# Patient Record
Sex: Male | Born: 1980 | Race: White | Hispanic: No | Marital: Single | Smoking: Never smoker
Health system: Southern US, Community
[De-identification: ages and names within clinical notes are randomized; demographics above are authoritative.]

## PROBLEM LIST (undated history)

## (undated) DIAGNOSIS — K219 Gastro-esophageal reflux disease without esophagitis: Secondary | ICD-10-CM

## (undated) HISTORY — PX: FRACTURE SURGERY: SHX138

---

## 2011-05-02 DIAGNOSIS — J45990 Exercise induced bronchospasm: Secondary | ICD-10-CM | POA: Insufficient documentation

## 2011-05-02 DIAGNOSIS — R109 Unspecified abdominal pain: Secondary | ICD-10-CM | POA: Insufficient documentation

## 2011-05-02 DIAGNOSIS — K219 Gastro-esophageal reflux disease without esophagitis: Secondary | ICD-10-CM | POA: Insufficient documentation

## 2012-04-02 ENCOUNTER — Ambulatory Visit: Payer: Self-pay | Admitting: Family Medicine

## 2012-11-14 DIAGNOSIS — S02401D Maxillary fracture, unspecified, subsequent encounter for fracture with routine healing: Secondary | ICD-10-CM | POA: Insufficient documentation

## 2013-01-09 DIAGNOSIS — Z09 Encounter for follow-up examination after completed treatment for conditions other than malignant neoplasm: Secondary | ICD-10-CM | POA: Insufficient documentation

## 2013-01-09 DIAGNOSIS — S0292XA Unspecified fracture of facial bones, initial encounter for closed fracture: Secondary | ICD-10-CM | POA: Insufficient documentation

## 2013-07-25 ENCOUNTER — Ambulatory Visit: Payer: Self-pay | Admitting: Otolaryngology

## 2016-03-25 DIAGNOSIS — K529 Noninfective gastroenteritis and colitis, unspecified: Secondary | ICD-10-CM | POA: Insufficient documentation

## 2016-04-01 DIAGNOSIS — M545 Low back pain, unspecified: Secondary | ICD-10-CM | POA: Insufficient documentation

## 2019-05-01 ENCOUNTER — Other Ambulatory Visit: Payer: Self-pay

## 2019-05-01 DIAGNOSIS — Z20822 Contact with and (suspected) exposure to covid-19: Secondary | ICD-10-CM

## 2019-05-02 ENCOUNTER — Telehealth: Payer: Self-pay | Admitting: General Practice

## 2019-05-02 LAB — NOVEL CORONAVIRUS, NAA: SARS-CoV-2, NAA: NOT DETECTED

## 2019-05-02 NOTE — Telephone Encounter (Signed)
Pt's wife was informed of negative COVID-19 test result.  Pt's wife expressed understanding.

## 2019-06-20 ENCOUNTER — Other Ambulatory Visit: Payer: Self-pay

## 2020-05-05 ENCOUNTER — Ambulatory Visit
Admission: EM | Admit: 2020-05-05 | Discharge: 2020-05-05 | Disposition: A | Discharge status: Home / Self Care | Payer: 59

## 2020-05-05 ENCOUNTER — Other Ambulatory Visit: Payer: Self-pay

## 2020-05-05 DIAGNOSIS — K2100 Gastro-esophageal reflux disease with esophagitis, without bleeding: Secondary | ICD-10-CM

## 2020-05-05 DIAGNOSIS — R1084 Generalized abdominal pain: Secondary | ICD-10-CM | POA: Diagnosis not present

## 2020-05-05 MED ORDER — FAMOTIDINE 20 MG PO TABS: 20.0000 mg | ORAL_TABLET | Freq: Two times a day (BID) | ORAL | 0 refills | Status: DC

## 2020-05-05 MED ORDER — METRONIDAZOLE 500 MG PO TABS: 500.0000 mg | ORAL_TABLET | Freq: Two times a day (BID) | ORAL | 0 refills | Status: DC

## 2020-05-05 MED ORDER — CIPROFLOXACIN HCL 500 MG PO TABS: 500.0000 mg | ORAL_TABLET | Freq: Two times a day (BID) | ORAL | 0 refills | Status: DC

## 2020-05-05 NOTE — ED Provider Notes (Signed)
[facility]   [id number] 05/05/20 Arrival Time: 1056  CC: ABDOMINAL PAIN  SUBJECTIVE:  Carl Mccarty is a 39 y.o. male who presents with complaint of abdominal discomfort that began gradually about 2 months ago. Reports that he does have hx of epiploic appendagitis. Takes omeprazole for GERD BID. Denies a precipitating event, trauma, close contacts with similar symptoms, recent travel or antibiotic use. Localizes pain to generalized upper abdomen. Describes as constant and achy and burning in character. Has been taking ibuprofen for epiploic appendagitis. Denies alleviating or aggravating factors. Denies similar symptoms in the past.     Denies fever, chills, appetite changes, weight changes, nausea, vomiting, chest pain, SOB, diarrhea, constipation, hematochezia, melena, dysuria, difficulty urinating, increased frequency or urgency, flank pain, loss of bowel or bladder function   ROS: As per HPI.  All other pertinent ROS negative.     History reviewed. No pertinent past medical history. Past Surgical History:  Procedure Laterality Date  . FRACTURE SURGERY     Allergies  Allergen Reactions  . Oxycodone Rash   No current facility-administered medications on file prior to encounter.   Current Outpatient Medications on File Prior to Encounter  Medication Sig Dispense Refill  . omeprazole (PRILOSEC) 40 MG capsule Take 40 mg by mouth daily.     Social History   Socioeconomic History  . Marital status: Single    Spouse name: Not on file  . Number of children: Not on file  . Years of education: Not on file  . Highest education level: Not on file  Occupational History  . Not on file  Tobacco Use  . Smoking status: Never Smoker  . Smokeless tobacco: Never Used  Vaping Use  . Vaping Use: Never used  Substance and Sexual Activity  . Alcohol use: Never  . Drug use: Never  . Sexual activity: Not on file  Other Topics Concern  . Not on file  Social History Narrative   . Not on file   Social Determinants of Health   Financial Resource Strain:   . Difficulty of Paying Living Expenses: Not on file  Food Insecurity:   . Worried About Programme researcher, broadcasting/film/video in the Last Year: Not on file  . Ran Out of Food in the Last Year: Not on file  Transportation Needs:   . Lack of Transportation (Medical): Not on file  . Lack of Transportation (Non-Medical): Not on file  Physical Activity:   . Days of Exercise per Week: Not on file  . Minutes of Exercise per Session: Not on file  Stress:   . Feeling of Stress : Not on file  Social Connections:   . Frequency of Communication with Friends and Family: Not on file  . Frequency of Social Gatherings with Friends and Family: Not on file  . Attends Religious Services: Not on file  . Active Member of Clubs or Organizations: Not on file  . Attends Banker Meetings: Not on file  . Marital Status: Not on file  Intimate Partner Violence:   . Fear of Current or Ex-Partner: Not on file  . Emotionally Abused: Not on file  . Physically Abused: Not on file  . Sexually Abused: Not on file   Family History  Problem Relation Age of Onset  . Breast cancer Mother   . Dementia Father      OBJECTIVE:  Vitals:   05/05/20 1116 05/05/20 1118  BP:  (!) 132/96  Pulse:  69  Resp:  18  Temp:  98.2 F (36.8 C)  TempSrc:  Oral  SpO2:  99%  Weight: 180 lb (81.6 kg)   Height: 5\' 8"  (1.727 m)     General appearance: Alert; NAD HEENT: NCAT.  Oropharynx clear.  Lungs: clear to auscultation bilaterally without adventitious breath sounds Heart: regular rate and rhythm.  Radial pulses 2+ symmetrical bilaterally Abdomen: soft, non-distended; normal active bowel sounds; generalized tenderness to upper abdomen on deep palpation; nontender at McBurney's point; negative Murphy's sign; negative rebound; no guarding Back: no CVA tenderness Extremities: no edema; symmetrical with no gross deformities Skin: warm and  dry Neurologic: normal gait Psychological: alert and cooperative; normal mood and affect  LABS: No results found for this or any previous visit (from the past 24 hour(s)).  DIAGNOSTIC STUDIES: No results found.   ASSESSMENT & PLAN:  1. Generalized abdominal pain   2. Gastroesophageal reflux disease with esophagitis without hemorrhage     Meds ordered this encounter  Medications  . famotidine (PEPCID) 20 MG tablet    Sig: Take 1 tablet (20 mg total) by mouth 2 (two) times daily.    Dispense:  30 tablet    Refill:  0    Order Specific Question:   Supervising Provider    Answer:   Merrilee Jansky  . ciprofloxacin (CIPRO) 500 MG tablet    Sig: Take 1 tablet (500 mg total) by mouth 2 (two) times daily.    Dispense:  14 tablet    Refill:  0    Order Specific Question:   Supervising Provider    Answer:   [id number] Merrilee Jansky  . metroNIDAZOLE (FLAGYL) 500 MG tablet    Sig: Take 1 tablet (500 mg total) by mouth 2 (two) times daily.    Dispense:  14 tablet    Refill:  0    Order Specific Question:   Supervising Provider    Answer:   [id number] Merrilee Jansky     Suspect inflammatory and possibly infectious process Prescribed pepcid 20mg  BID Prescribed Cipro Prescribed Flagyl Upon reviewing CT report and note from [facility], it appears that infectious process could not be ruled out and patient was treated with antiinflammatories first  If you experience new or worsening symptoms return or go to ER such as fever, chills, nausea, vomiting, diarrhea, bloody or dark tarry stools, constipation, urinary symptoms, worsening abdominal discomfort, symptoms that do not improve with medications, inability to keep fluids down.  Reviewed expectations re: course of current medical issues. Questions answered. Outlined signs and symptoms indicating need for more acute intervention. Patient verbalized understanding. After Visit Summary given.   [[id number]], NP 05/05/20  1211

## 2020-05-05 NOTE — ED Triage Notes (Signed)
Patient states that he has been having abdominal pain that started back in June. Reports that he had a CT scan at [facility] in July and showed epiplogic appendagitis. States that he finished the ibuprofen they gave him but has not had any relief since.

## 2020-05-05 NOTE — Discharge Instructions (Addendum)
I have prescribed Cipro for you to take twice day for 7 days  I have also sent in flagyl for you to take twice a day for 7 days  Take Pepcid 20mg  twice a day with meals for 14 days  Follow up with this office or with primary care if symptoms are persisting  Follow up in the ER for high fever, trouble swallowing, trouble breathing, other concerning symptoms

## 2020-06-09 ENCOUNTER — Other Ambulatory Visit: Payer: Self-pay

## 2020-06-09 ENCOUNTER — Ambulatory Visit (INDEPENDENT_AMBULATORY_CARE_PROVIDER_SITE_OTHER)
Admission: RE | Admit: 2020-06-09 | Discharge: 2020-06-09 | Disposition: A | Discharge status: Home / Self Care | Payer: 59 | Source: Ambulatory Visit | Attending: Family Medicine | Admitting: Family Medicine

## 2020-06-09 ENCOUNTER — Ambulatory Visit
Admission: RE | Admit: 2020-06-09 | Discharge: 2020-06-09 | Disposition: A | Discharge status: Home / Self Care | Payer: 59 | Source: Ambulatory Visit | Attending: Family Medicine | Admitting: Family Medicine

## 2020-06-09 VITALS — BP 128/96 | HR 78 | Temp 98.3°F | Resp 18 | Ht 68.0 in | Wt 180.0 lb

## 2020-06-09 DIAGNOSIS — R109 Unspecified abdominal pain: Secondary | ICD-10-CM

## 2020-06-09 DIAGNOSIS — K6389 Other specified diseases of intestine: Secondary | ICD-10-CM | POA: Diagnosis present

## 2020-06-09 HISTORY — DX: Gastro-esophageal reflux disease without esophagitis: K21.9

## 2020-06-09 LAB — CBC WITH DIFFERENTIAL/PLATELET
Abs Immature Granulocytes: 0.02 10*3/uL (ref 0.00–0.07)
Basophils Absolute: 0 10*3/uL (ref 0.0–0.1)
Basophils Relative: 0 %
Eosinophils Absolute: 0.1 10*3/uL (ref 0.0–0.5)
Eosinophils Relative: 2 %
HCT: 44.9 % (ref 39.0–52.0)
Hemoglobin: 15.3 g/dL (ref 13.0–17.0)
Immature Granulocytes: 0 %
Lymphocytes Relative: 28 %
Lymphs Abs: 2.2 10*3/uL (ref 0.7–4.0)
MCH: 29 pg (ref 26.0–34.0)
MCHC: 34.1 g/dL (ref 30.0–36.0)
MCV: 85 fL (ref 80.0–100.0)
Monocytes Absolute: 0.7 10*3/uL (ref 0.1–1.0)
Monocytes Relative: 8 %
Neutro Abs: 4.9 10*3/uL (ref 1.7–7.7)
Neutrophils Relative %: 62 %
Platelets: 318 10*3/uL (ref 150–400)
RBC: 5.28 MIL/uL (ref 4.22–5.81)
RDW: 12.2 % (ref 11.5–15.5)
WBC: 7.8 10*3/uL (ref 4.0–10.5)
nRBC: 0 % (ref 0.0–0.2)

## 2020-06-09 LAB — COMPREHENSIVE METABOLIC PANEL
ALT: 31 U/L (ref 0–44)
AST: 23 U/L (ref 15–41)
Albumin: 4.9 g/dL (ref 3.5–5.0)
Alkaline Phosphatase: 69 U/L (ref 38–126)
Anion gap: 8 (ref 5–15)
BUN: 17 mg/dL (ref 6–20)
CO2: 28 mmol/L (ref 22–32)
Calcium: 9.5 mg/dL (ref 8.9–10.3)
Chloride: 102 mmol/L (ref 98–111)
Creatinine, Ser: 0.97 mg/dL (ref 0.61–1.24)
GFR, Estimated: >60 mL/min (ref >60)
Glucose, Bld: 100 mg/dL — ABNORMAL HIGH (ref 70–99)
Potassium: 4 mmol/L (ref 3.5–5.1)
Sodium: 138 mmol/L (ref 135–145)
Total Bilirubin: 0.5 mg/dL (ref 0.3–1.2)
Total Protein: 8.1 g/dL (ref 6.5–8.1)

## 2020-06-09 LAB — LIPASE, BLOOD: Lipase: 38 U/L (ref 11–51)

## 2020-06-09 MED ORDER — IOHEXOL 300 MG/ML  SOLN
100.0000 mL | Freq: Once | INTRAMUSCULAR | Status: AC | PRN
  Administered 2020-06-09: 100 mL via INTRAVENOUS

## 2020-06-09 MED ORDER — TRAMADOL HCL 50 MG PO TABS: 50.0000 mg | ORAL_TABLET | Freq: Three times a day (TID) | ORAL | 0 refills | Status: DC | PRN

## 2020-06-09 NOTE — ED Triage Notes (Signed)
Called [employer] at [phone] for prior auth of CT Abd/pelvis with contrast (CPT [phone]). Spoke with Kurtis Bushman, approved, auth# [id number] valid 06/09/20 to 07/24/20.

## 2020-06-09 NOTE — ED Provider Notes (Signed)
[facility]    CSN: [id number] Arrival date & time: 06/09/20  1357      History   Chief Complaint Chief Complaint  Patient presents with  . Appointment  . Abdominal Pain  . Nausea   HPI  39 year old Mccarty presents with persistent abdominal pain.  Patient states that he has had this for the past 6 months.  He was previously seen in July and had a CAT scan done by his primary care physician.  CT revealed epiploic appendagitis.  He has been treated conservatively and has also been treated with antibiotics for possible diverticulitis.  He has failed to improve.  Continues to have pain, 8/10 in severity.  He has had some associated nausea today.  No relieving factors.  No fever.  No diarrhea.  He endorses some mild constipation.  Pain is located in the left lower quadrant.  No other complaints.  Past Medical History:  Diagnosis Date  . GERD (gastroesophageal reflux disease)    Past Surgical History:  Procedure Laterality Date  . FRACTURE SURGERY     Home Medications    Prior to Admission medications   Medication Sig Start Date End Date Taking? Authorizing Provider  omeprazole (PRILOSEC) 40 MG capsule Take 40 mg by mouth daily. 04/14/20  Yes [provider]  traMADol (ULTRAM) Carl MG tablet Take 1 tablet (Carl mg total) by mouth every 8 (eight) hours as needed. 06/09/20   Tommie Sams, DO  famotidine (PEPCID) 20 MG tablet Take 1 tablet (20 mg total) by mouth 2 (two) times daily. 05/05/20 06/09/20  Moshe Cipro, NP    Family History Family History  Problem Relation Age of Onset  . Breast cancer Mother   . Dementia Father     Social History Social History   Tobacco Use  . Smoking status: Never Smoker  . Smokeless tobacco: Never Used  Vaping Use  . Vaping Use: Never used  Substance Use Topics  . Alcohol use: Never  . Drug use: Never     Allergies   Oxycodone   Review of Systems Review of Systems  Constitutional: Negative for fever.   Gastrointestinal: Positive for abdominal pain and nausea.   Physical Exam Triage Vital Signs ED Triage Vitals [06/09/20 1413]  Enc Vitals Group     BP (!) 135/113     Pulse Rate 78     Resp 18     Temp 98.3 F (36.8 C)     Temp Source Oral     SpO2 99 %     Weight 180 lb (81.6 kg)     Height 5\' 8"  (1.727 m)     Head Circumference      Peak Flow      Pain Score 8     Pain Loc      Pain Edu?      Excl. in GC?    Updated Vital Signs BP (!) 128/96 (BP Location: Right Arm)   Pulse 78   Temp 98.3 F (36.8 C) (Oral)   Resp 18   Ht 5\' 8"  (1.727 m)   Wt 81.6 kg   SpO2 99%   BMI 27.37 kg/m   Visual Acuity Right Eye Distance:   Left Eye Distance:   Bilateral Distance:    Right Eye Near:   Left Eye Near:    Bilateral Near:     Physical Exam Vitals and nursing note reviewed.  Constitutional:      General: He is not in acute  distress.    Appearance: He is well-developed. He is not ill-appearing.  HENT:     Head: Normocephalic and atraumatic.  Eyes:     General:        Right eye: No discharge.        Left eye: No discharge.     Conjunctiva/sclera: Conjunctivae normal.  Cardiovascular:     Rate and Rhythm: Normal rate and regular rhythm.     Heart sounds: No murmur heard.   Pulmonary:     Effort: Pulmonary effort is normal.     Breath sounds: Normal breath sounds. No wheezing, rhonchi or rales.  Abdominal:     General: There is no distension.     Palpations: Abdomen is soft.     Comments: LLQ tenderness to palpation.  Neurological:     Mental Status: He is alert.  Psychiatric:        Mood and Affect: Mood normal.        Behavior: Behavior normal.    UC Treatments / Results  Labs (all labs ordered are listed, but only abnormal results are displayed) Labs Reviewed  COMPREHENSIVE METABOLIC PANEL - Abnormal; Notable for the following components:      Result Value   Glucose, Bld 100 (*)    All other components within normal limits  CBC WITH  DIFFERENTIAL/PLATELET  LIPASE, BLOOD    EKG   Radiology CT ABDOMEN PELVIS W CONTRAST  Result Date: 06/09/2020 CLINICAL DATA:  Continued pain on antibiotics EXAM: CT ABDOMEN AND PELVIS WITH CONTRAST TECHNIQUE: Multidetector CT imaging of the abdomen and pelvis was performed using the standard protocol following bolus administration of intravenous contrast. CONTRAST:  OMNIPAQUE IOHEXOL 300 MG/ML  SOLN COMPARISON:  The prior CT is not available for comparison. FINDINGS: Lower chest: Lung bases demonstrate no acute consolidation or effusion. Normal cardiac size. Hepatobiliary: No focal liver abnormality is seen. No gallstones, gallbladder wall thickening, or biliary dilatation. Pancreas: Unremarkable. No pancreatic ductal dilatation or surrounding inflammatory changes. Spleen: Normal in size without focal abnormality. Adrenals/Urinary Tract: Adrenal glands are unremarkable. Kidneys are normal, without renal calculi, focal lesion, or hydronephrosis. Bladder is unremarkable. Stomach/Bowel: The stomach is nonenlarged. No dilated small bowel. Negative appendix. No colon wall thickening. Few isolated diverticula of the left colon. Vascular/Lymphatic: Nonaneurysmal aorta.  No suspicious adenopathy. Reproductive: Prostate is unremarkable. Other: Negative for free air or free fluid. 11 mm left lower quadrant soft tissue nodule, anterior to the distal descending colon with minimal surrounding soft tissue stranding. Musculoskeletal: No acute or significant osseous findings. IMPRESSION: 1. 11 mm left lower quadrant soft tissue nodule, anterior to the distal descending colon with minimal surrounding soft tissue stranding. Findings are nonspecific and could be secondary to sequela of prior small omental infarct or epiploic appendagitis. Consider short interval CT follow-up to ensure resolution. The adjacent colon appears normal. 2. Otherwise no CT evidence for acute intra-abdominal or pelvic abnormality.  Electronically Signed   By: Jasmine Pang M.D.   On: 06/09/2020 16:01    Procedures Procedures (including critical care time)  Medications Ordered in UC Medications - No data to display  Initial Impression / Assessment and Plan / UC Course  I have reviewed the triage vital signs and the nursing notes.  Pertinent labs & imaging results that were available during my care of the patient were reviewed by me and considered in my medical decision making (see chart for details).    39 year old Mccarty presents with persistent/chronic left lower quadrant pain.  CT  abdomen and pelvis obtained today.  He has a 11 mm soft tissue nodule that is likely consistent with omental infarct versus epiploic appendagitis.  CT in July of this year showed epiploic appendagitis.  I have spoken with GI Dr. Servando Snare regarding his case.  He recommended that he be referred to surgery.  Referral to general surgery placed.  Tramadol for pain.  Final Clinical Impressions(s) / UC Diagnoses   Final diagnoses:  Epiploic appendagitis     Discharge Instructions     Pain medication as directed.  I have placed a referral to general surgery. Give them a call so that appt can be set up.  Take care  Dr. Adriana Simas    ED Prescriptions    Medication Sig Dispense Auth. Provider   traMADol (ULTRAM) Carl MG tablet Take 1 tablet (Carl mg total) by mouth every 8 (eight) hours as needed. 15 tablet Everlene Other G, DO     I have reviewed the PDMP during this encounter.   Tommie Sams, [place] 06/09/20 1654

## 2020-06-09 NOTE — ED Triage Notes (Signed)
Patient in today c/o abdominal pain and nausea x 6 months. Patient seen for same at [facility] on 05/05/20 for same symptoms. Patient saw his PCP in June for the same symptoms and patient had a CT scan that showed epiploic appendagitis in the left mid pelvic. Patient has not followed back up with PCP.

## 2020-06-09 NOTE — Discharge Instructions (Signed)
Pain medication as directed.  I have placed a referral to general surgery. Give them a call so that appt can be set up.  Take care  Dr. Adriana Simas

## 2020-06-16 ENCOUNTER — Ambulatory Visit (INDEPENDENT_AMBULATORY_CARE_PROVIDER_SITE_OTHER): Payer: 59 | Admitting: Surgery

## 2020-06-16 ENCOUNTER — Encounter: Payer: Self-pay | Admitting: Surgery

## 2020-06-16 ENCOUNTER — Other Ambulatory Visit: Payer: Self-pay

## 2020-06-16 VITALS — BP 130/91 | HR 83 | Temp 98.5°F | Ht 68.0 in | Wt 179.6 lb

## 2020-06-16 DIAGNOSIS — K6389 Other specified diseases of intestine: Secondary | ICD-10-CM

## 2020-06-16 NOTE — Patient Instructions (Addendum)
You can get some fiber supplements over-the-counter to help with constipation, eat a high fiber diet and drink plenty of water. If you have any concerns or questions, please feel free to call our office.    High-Fiber Diet Fiber, also called dietary fiber, is a type of carbohydrate that is found in fruits, vegetables, whole grains, and beans. A high-fiber diet can have many health benefits. Your health care provider may recommend a high-fiber diet to help:  Prevent constipation. Fiber can make your bowel movements more regular.  Lower your cholesterol.  Relieve the following conditions: ? Swelling of veins in the anus (hemorrhoids). ? Swelling and irritation (inflammation) of specific areas of the digestive tract (uncomplicated diverticulosis). ? A problem of the large intestine (colon) that sometimes causes pain and diarrhea (irritable bowel syndrome, IBS).  Prevent overeating as part of a weight-loss plan.  Prevent heart disease, type 2 diabetes, and certain cancers. What is my plan? The recommended daily fiber intake in grams (g) includes:  38 g for men age 68 or younger.  30 g for men over age 49.  25 g for women age 46 or younger.  21 g for women over age 2. You can get the recommended daily intake of dietary fiber by:  Eating a variety of fruits, vegetables, grains, and beans.  Taking a fiber supplement, if it is not possible to get enough fiber through your diet. What do I need to know about a high-fiber diet?  It is better to get fiber through food sources rather than from fiber supplements. There is not a lot of research about how effective supplements are.  Always check the fiber content on the nutrition facts label of any prepackaged food. Look for foods that contain 5 g of fiber or more per serving.  Talk with a diet and nutrition specialist (dietitian) if you have questions about specific foods that are recommended or not recommended for your medical condition,  especially if those foods are not listed below.  Gradually increase how much fiber you consume. If you increase your intake of dietary fiber too quickly, you may have bloating, cramping, or gas.  Drink plenty of water. Water helps you to digest fiber. What are tips for following this plan?  Eat a wide variety of high-fiber foods.  Make sure that half of the grains that you eat each day are whole grains.  Eat breads and cereals that are made with whole-grain flour instead of refined flour or white flour.  Eat brown rice, bulgur wheat, or millet instead of white rice.  Start the day with a breakfast that is high in fiber, such as a cereal that contains 5 g of fiber or more per serving.  Use beans in place of meat in soups, salads, and pasta dishes.  Eat high-fiber snacks, such as berries, raw vegetables, nuts, and popcorn.  Choose whole fruits and vegetables instead of processed forms like juice or sauce. What foods can I eat?  Fruits Berries. Pears. Apples. Oranges. Avocado. Prunes and raisins. Dried figs. Vegetables Sweet potatoes. Spinach. Kale. Artichokes. Cabbage. Broccoli. Cauliflower. Green peas. Carrots. Squash. Grains Whole-grain breads. Multigrain cereal. Oats and oatmeal. Brown rice. Barley. Bulgur wheat. Millet. Quinoa. Bran muffins. Popcorn. Rye wafer crackers. Meats and other proteins Navy, kidney, and pinto beans. Soybeans. Split peas. Lentils. Nuts and seeds. Dairy Fiber-fortified yogurt. Beverages Fiber-fortified soy milk. Fiber-fortified orange juice. Other foods Fiber bars. The items listed above may not be a complete list of recommended foods and  beverages. Contact a dietitian for more options. What foods are not recommended? Fruits Fruit juice. Cooked, strained fruit. Vegetables Fried potatoes. Canned vegetables. Well-cooked vegetables. Grains White bread. Pasta made with refined flour. White rice. Meats and other proteins Fatty cuts of meat. Fried  chicken or fried fish. Dairy Milk. Yogurt. Cream cheese. Sour cream. Fats and oils Butters. Beverages Soft drinks. Other foods Cakes and pastries. The items listed above may not be a complete list of foods and beverages to avoid. Contact a dietitian for more information. Summary  Fiber is a type of carbohydrate. It is found in fruits, vegetables, whole grains, and beans.  There are many health benefits of eating a high-fiber diet, such as preventing constipation, lowering blood cholesterol, helping with weight loss, and reducing your risk of heart disease, diabetes, and certain cancers.  Gradually increase your intake of fiber. Increasing too fast can result in cramping, bloating, and gas. Drink plenty of water while you increase your fiber.  The best sources of fiber include whole fruits and vegetables, whole grains, nuts, seeds, and beans. This information is not intended to replace advice given to you by your health care provider. Make sure you discuss any questions you have with your health care provider. Document Revised: 04/24/2017 Document Reviewed: 04/24/2017 Elsevier Patient Education  2020 Elsevier Inc.     Epiploic Appendagitis  Epiploic appendagitis (EA) is swelling and irritation of pouches (epiploic appendages) that are attached to the end portion of the large intestine (colon). These pouches contain fat and are attached to the outside of the colon. This condition causes sudden pain in the lower abdomen. EA is rare, and it usually goes away on its own. It can feel like other abdomen (abdominal) conditions, such as appendicitis, a gallbladder attack (cholecystitis), or diverticulitis. What are the causes? This condition may be caused by:  Blocked blood flow due to a blood clot.  Twisting (torsion) of the epiploic appendages.  Conditions that cause swelling and inflammation of nearby tissue, such as long-term (chronic) diarrhea, Crohn disease, or ulcerative  colitis. What increases the risk? You are more likely to develop this condition if:  You are 65-7 years old.  You are male.  You are overweight. What are the signs or symptoms? The most common symptom of this condition is lower abdominal pain that starts suddenly and can be severe. Pain can be anywhere in the lower abdomen, but it is more common on the left side. The pain may get worse with movement or when pressing on your abdomen. The following symptoms are possible, but they are not common:  Fever.  Nausea.  Diarrhea or constipation. How is this diagnosed? Your health care provider may suspect EA if you have sudden lower abdominal pain without other symptoms. The condition may be diagnosed based on:  CT scan. This is the best way to diagnose EA.  Your symptoms and a physical exam.  Ultrasound.  A blood test (complete blood count, CBC).  A procedure to look inside your abdomen using a lighted scope that has a tiny camera on the end (laparoscopy). This may be done to confirm the diagnosis. How is this treated? EA usually goes away without treatment. Your health care provider may recommend NSAIDs (such as aspirin or ibuprofen) to reduce pain and inflammation. In rare cases, if the condition does not improve or it keeps coming back, you may need surgery to remove the appendages. Follow these instructions at home:  Take over-the-counter and prescription medicines only as told  by your health care provider.  Return to your normal activities as told by your health care provider. Ask your health care provider what activities are safe for you.  Keep all follow-up visits as told by your health care provider. This is important. Contact a health care provider if:  You have a fever.  You develop nausea, vomiting, diarrhea, or constipation.  Your pain gets worse.  Your pain lasts longer than 10 days. Get help right away if:  You have severe pain that does not get better after  you take medicine. Summary  Epiploic appendagitis (EA) is swelling and irritation of pouches (epiploic appendages) that are attached to the outside of the colon. The colon is the end portion of the large intestine.  EA can feel like other abdominal conditions, such as appendicitis, a gallbladder attack (cholecystitis), or diverticulitis.  EA usually goes away without treatment. Your health care provider may recommend NSAIDs (such as aspirin or ibuprofen) to reduce pain and inflammation. This information is not intended to replace advice given to you by your health care provider. Make sure you discuss any questions you have with your health care provider. Document Revised: 10/11/2018 Document Reviewed: 01/05/2017 Elsevier Patient Education  2020 [employer].

## 2020-06-16 NOTE — Progress Notes (Signed)
Patient ID: Carl Mccarty, male   DOB: 06/01/1981, 39 y.o.   MRN: [id number]  Chief Complaint: Left upper quadrant pain  History of Present Illness Carl Mccarty is a 39 y.o. male with about a 31-month history of left upper quadrant pain which radiates down the left lateral abdomen and across the epigastrium.  Prior history of pyrosis, currently treated with PPI.  He reports having a colonoscopy years ago.  He admits marked irregularity of his bowel habits.  He admits to in a regular diet, and incomplete rectal emptying.  He was worked up for some pain of the left upper quadrant, found to have an episode of epiploic appendagitis, and referred with the imaging.  Despite a course of antibiotics and anti-inflammatories I think the presenting pain resolved on its own.  The pain may have not been directly related to his current bowel function.  I suspect his longstanding symptoms may have more to do with his irregularity.  Past Medical History Past Medical History:  Diagnosis Date   GERD (gastroesophageal reflux disease)       Past Surgical History:  Procedure Laterality Date   FRACTURE SURGERY      Allergies  Allergen Reactions   Oxycodone Rash    Current Outpatient Medications  Medication Sig Dispense Refill   omeprazole (PRILOSEC) 40 MG capsule Take 40 mg by mouth daily.     No current facility-administered medications for this visit.    Family History Family History  Problem Relation Age of Onset   Breast cancer Mother    Dementia Father       Social History Social History   Tobacco Use   Smoking status: Never Smoker   Smokeless tobacco: Never Used  Building services engineer Use: Never used  Substance Use Topics   Alcohol use: Never   Drug use: Never        Review of Systems  Constitutional: Negative.   HENT: Negative.   Eyes: Negative.   Respiratory: Positive for shortness of breath.   Cardiovascular: Negative.   Gastrointestinal: Positive for  abdominal pain, constipation, heartburn and nausea. Negative for blood in stool.  Genitourinary: Positive for frequency.  Skin: Negative.   Neurological: Negative.   Psychiatric/Behavioral: Negative.       Physical Exam Blood pressure (!) 130/91, pulse 83, temperature 98.5 F (36.9 C), temperature source Oral, height 5\' 8"  (1.727 m), weight 179 lb 9.6 oz (81.5 kg), SpO2 97 %. Last Weight  Most recent update: 06/16/2020  2:57 PM   Weight  81.5 kg (179 lb 9.6 oz)            CONSTITUTIONAL: Well developed, and nourished, appropriately responsive and aware without distress.   EYES: Sclera non-icteric.   EARS, NOSE, MOUTH AND THROAT: Mask worn.    Hearing is intact to voice.  NECK: Trachea is midline, and there is no jugular venous distension.  LYMPH NODES:  Lymph nodes in the neck are not enlarged. RESPIRATORY:  Lungs are clear, and breath sounds are equal bilaterally. Normal respiratory effort without pathologic use of accessory muscles. CARDIOVASCULAR: Heart is regular in rate and rhythm. GI: The abdomen is soft, nontender, and nondistended. There were no palpable masses. I did not appreciate hepatosplenomegaly.  MUSCULOSKELETAL:  Symmetrical muscle tone appreciated in all four extremities.    SKIN: Skin turgor is normal. No pathologic skin lesions appreciated.  NEUROLOGIC:  Motor and sensation appear grossly normal.  Cranial nerves are grossly without defect. PSYCH:  Alert and oriented to  person, place and time. Affect is appropriate for situation.  Data Reviewed I have personally reviewed what is currently available of the patient's imaging, recent labs and medical records.   Labs:  CBC Latest Ref Rng & Units 06/09/2020  WBC 4.0 - 10.5 K/uL 7.8  Hemoglobin 13.0 - 17.0 g/dL 84.6  Hematocrit 96.2 - 52.0 % 44.9  Platelets 150 - 400 K/uL 318   CMP Latest Ref Rng & Units 06/09/2020  Glucose 70 - 99 mg/dL 952(W)  BUN 6 - 20 mg/dL 17  Creatinine 4.13 - 2.44 mg/dL 0.10  Sodium 272 -  536 mmol/L 138  Potassium 3.5 - 5.1 mmol/L 4.0  Chloride 98 - 111 mmol/L 102  CO2 22 - 32 mmol/L 28  Calcium 8.9 - 10.3 mg/dL 9.5  Total Protein 6.5 - 8.1 g/dL 8.1  Total Bilirubin 0.3 - 1.2 mg/dL 0.5  Alkaline Phos 38 - 126 U/L 69  AST 15 - 41 U/L 23  ALT 0 - 44 U/L 31      Imaging: Radiology review:   CLINICAL DATA:  Continued pain on antibiotics  EXAM: CT ABDOMEN AND PELVIS WITH CONTRAST  TECHNIQUE: Multidetector CT imaging of the abdomen and pelvis was performed using the standard protocol following bolus administration of intravenous contrast.  CONTRAST:  OMNIPAQUE IOHEXOL 300 MG/ML  SOLN  COMPARISON:  The prior CT is not available for comparison.  FINDINGS: Lower chest: Lung bases demonstrate no acute consolidation or effusion. Normal cardiac size.  Hepatobiliary: No focal liver abnormality is seen. No gallstones, gallbladder wall thickening, or biliary dilatation.  Pancreas: Unremarkable. No pancreatic ductal dilatation or surrounding inflammatory changes.  Spleen: Normal in size without focal abnormality.  Adrenals/Urinary Tract: Adrenal glands are unremarkable. Kidneys are normal, without renal calculi, focal lesion, or hydronephrosis. Bladder is unremarkable.  Stomach/Bowel: The stomach is nonenlarged. No dilated small bowel. Negative appendix. No colon wall thickening. Few isolated diverticula of the left colon.  Vascular/Lymphatic: Nonaneurysmal aorta.  No suspicious adenopathy.  Reproductive: Prostate is unremarkable.  Other: Negative for free air or free fluid. 11 mm left lower quadrant soft tissue nodule, anterior to the distal descending colon with minimal surrounding soft tissue stranding.  Musculoskeletal: No acute or significant osseous findings.  IMPRESSION: 1. 11 mm left lower quadrant soft tissue nodule, anterior to the distal descending colon with minimal surrounding soft tissue stranding. Findings are  nonspecific and could be secondary to sequela of prior small omental infarct or epiploic appendagitis. Consider short interval CT follow-up to ensure resolution. The adjacent colon appears normal. 2. Otherwise no CT evidence for acute intra-abdominal or pelvic abnormality.   Electronically Signed   By: Jasmine Pang M.D.   On: 06/09/2020 16:01  Within last 24 hrs: No results found.  Assessment    Abnormal CT scan.  Likely not explanatory for the patient's symptoms. Constipation. There are no problems to display for this patient.   Plan    Advised to pursue a goal of 25 to 30 g of fiber daily.  Made aware that the majority of this may be through natural sources, but advised to be aware of actual consumption and to ensure minimal consumption by daily supplementation.  Various forms of supplements discussed.  Strongly advised to consume more fluids to ensure adequate hydration, instructed to watch color of urine to determine adequacy of hydration.  Clarity is pursued in urine output, and bowel activity that correlates to significant meal intake.  Patient is to avoid deferring having bowel movements, advised  to take the time at the first sign of sensation, typically following meals and in the morning.  Subsequent utilization of MiraLAX to ensure at least daily movement, ideally twice daily bowel movements.  If multiple doses of MiraLAX are necessary utilize them. Follow-up in 4 to 6 weeks, or as needed Face-to-face time spent with the patient and accompanying care providers(if present) was 30 minutes, with more than 50% of the time spent counseling, educating, and coordinating care of the patient.      Campbell Lerner M.D., FACS 06/16/2020, 3:20 PM

## 2020-07-21 ENCOUNTER — Ambulatory Visit: Payer: 59 | Admitting: Surgery

## 2021-11-24 ENCOUNTER — Ambulatory Visit: Payer: 59

## 2021-11-25 ENCOUNTER — Other Ambulatory Visit: Payer: Self-pay | Admitting: Urology

## 2021-11-25 DIAGNOSIS — N5201 Erectile dysfunction due to arterial insufficiency: Secondary | ICD-10-CM

## 2021-11-25 DIAGNOSIS — M5489 Other dorsalgia: Secondary | ICD-10-CM

## 2021-11-25 DIAGNOSIS — R300 Dysuria: Secondary | ICD-10-CM

## 2021-12-08 ENCOUNTER — Ambulatory Visit
Admission: RE | Admit: 2021-12-08 | Discharge: 2021-12-08 | Disposition: A | Discharge status: Home / Self Care | Payer: 59 | Source: Ambulatory Visit | Attending: Urology | Admitting: Urology

## 2021-12-08 DIAGNOSIS — N5201 Erectile dysfunction due to arterial insufficiency: Secondary | ICD-10-CM | POA: Diagnosis present

## 2021-12-08 DIAGNOSIS — M5489 Other dorsalgia: Secondary | ICD-10-CM | POA: Diagnosis present

## 2021-12-08 DIAGNOSIS — R300 Dysuria: Secondary | ICD-10-CM | POA: Insufficient documentation

## 2021-12-08 MED ORDER — GADOBUTROL 1 MMOL/ML IV SOLN
7.5000 mL | Freq: Once | INTRAVENOUS | Status: AC | PRN
  Administered 2021-12-08: 7.5 mL via INTRAVENOUS

## 2022-10-28 IMAGING — MR MR LUMBAR SPINE WO/W CM
6 of 7 series · 32 of 48 positions shown · IV contrast (gadavist)
Comparison: None Available.

CLINICAL DATA: Dysuria 8ZL.L (WEE-55-CM). Erectile dysfunction due
to arterial insufficiency DA1.57 (WEE-55-CM). Back pain without
sciatica VVS.7U (WEE-55-CM).

EXAM:
MRI LUMBAR SPINE WITHOUT AND WITH CONTRAST
TECHNIQUE: Multiplanar and multiecho pulse sequences of the lumbar spine were
obtained without and with intravenous contrast.
CONTRAST:  7.5mL GADAVIST GADOBUTROL 1 MMOL/ML IV SOLN

[Series 5: T2 · sagittal · 4.0mm · 0.81mm/px · 5 of 17 slices shown (1 of 2)]
[im 1/17]
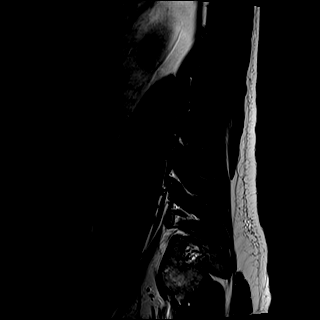
[im 5/17]
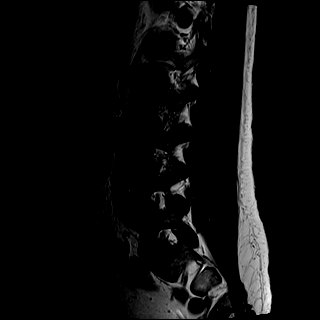
[im 9/17]
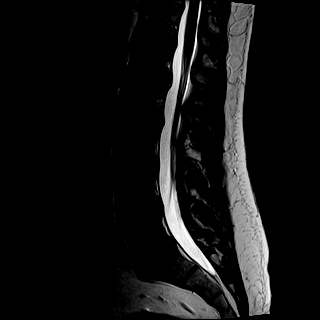
[im 13/17]
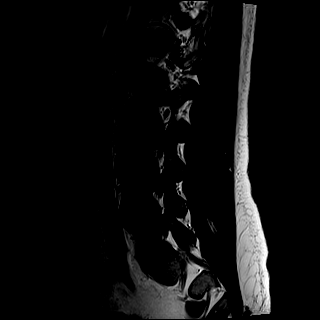
[im 17/17]
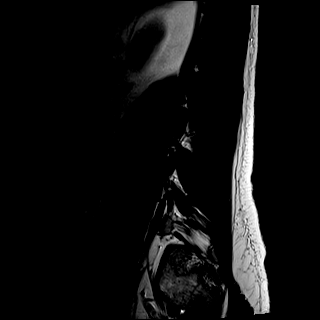

[Series 6: T1 · sagittal · 4.0mm · 0.81mm/px · 5 of 17 slices shown (1 of 2)]
[im 1/17]
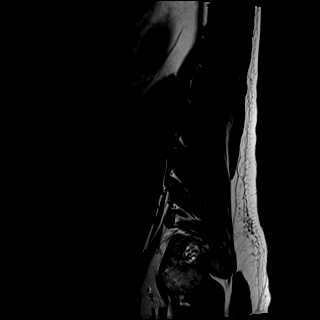
[im 5/17]
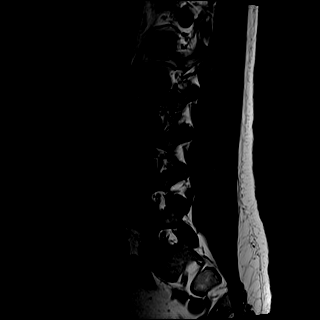
[im 9/17]
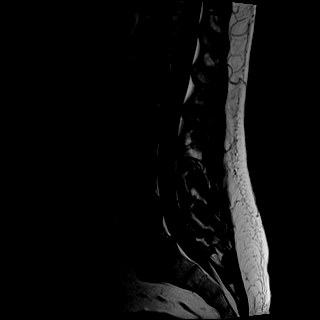
[im 13/17]
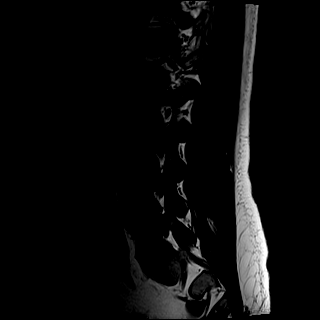
[im 17/17]
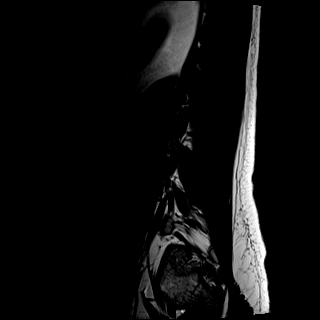

[Series 7: STIR · sagittal · 4.0mm · 0.41mm/px · 2 of 17 slices shown]
[im 1/17]
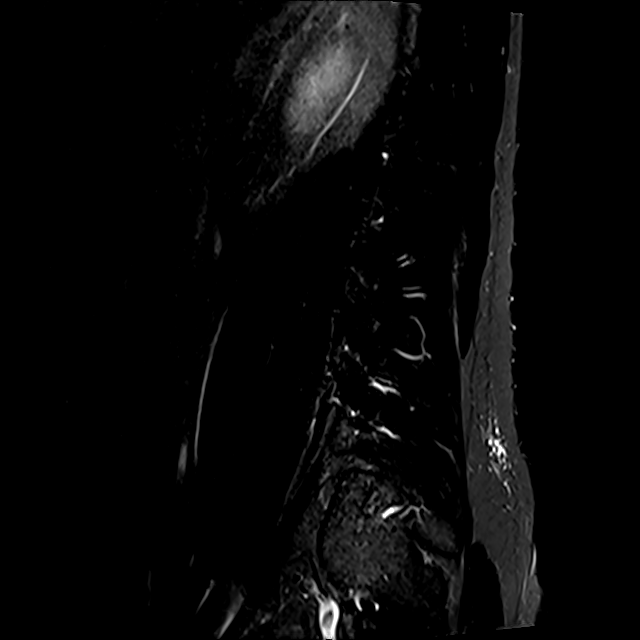
[im 6/17]
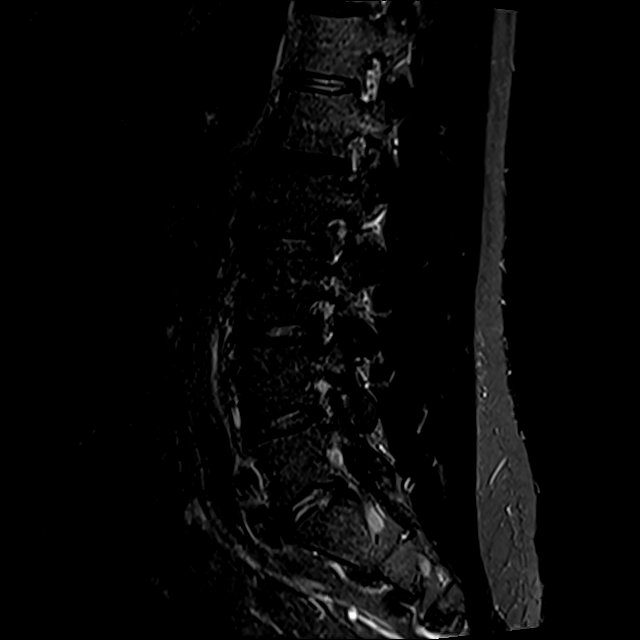

[Series 8: T2 · axial · 4.0mm · 0.78mm/px · z∈[-101,+123]mm · 8 of 38 slices shown (2 of 2)]
[im 1/38]
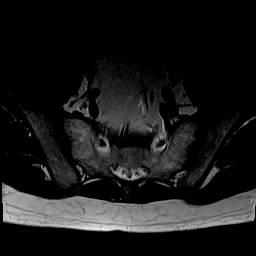
[im 5/38]
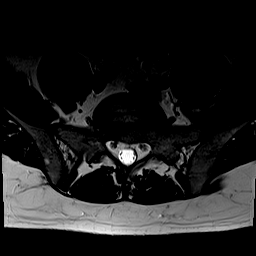
[im 13/38]
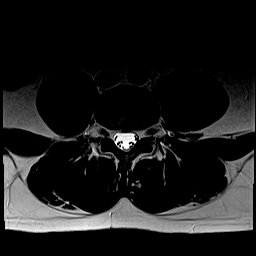
[im 17/38]
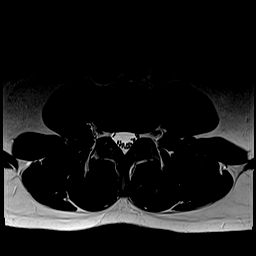
[im 21/38]
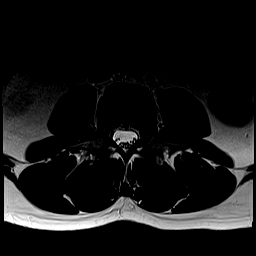
[im 25/38]
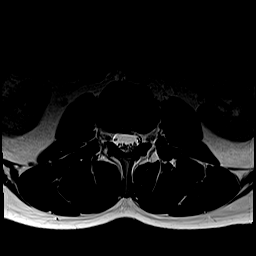
[im 33/38]
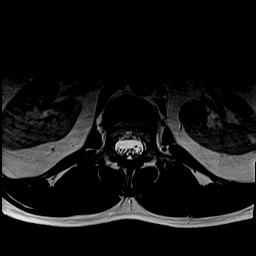
[im 38/38]
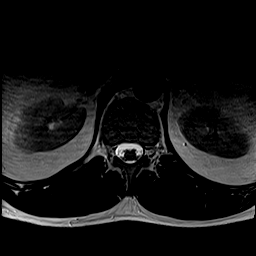

[Series 9: T1 · axial · 4.0mm · 0.39mm/px · z∈[-101,+123]mm · 8 of 38 slices shown (2 of 2)]
[im 1/38]
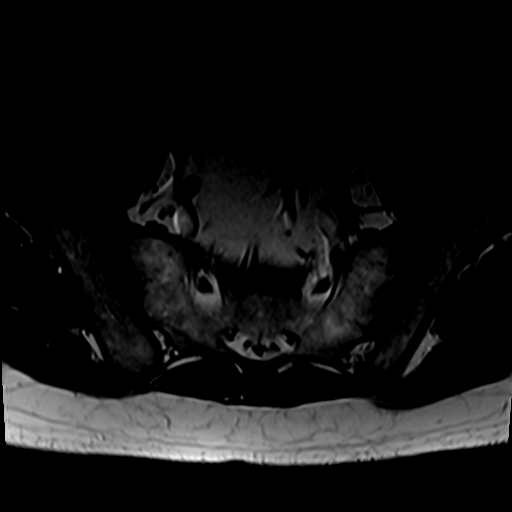
[im 5/38]
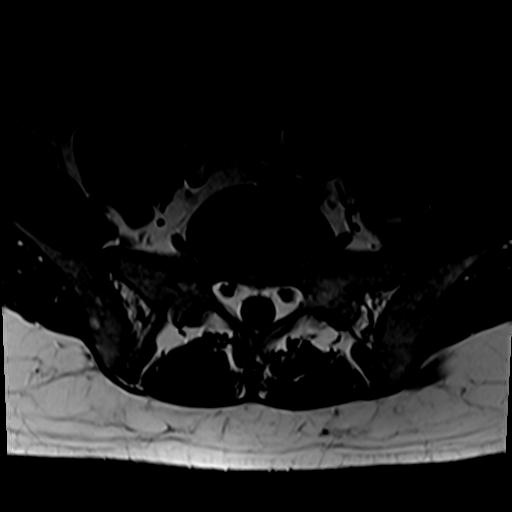
[im 13/38]
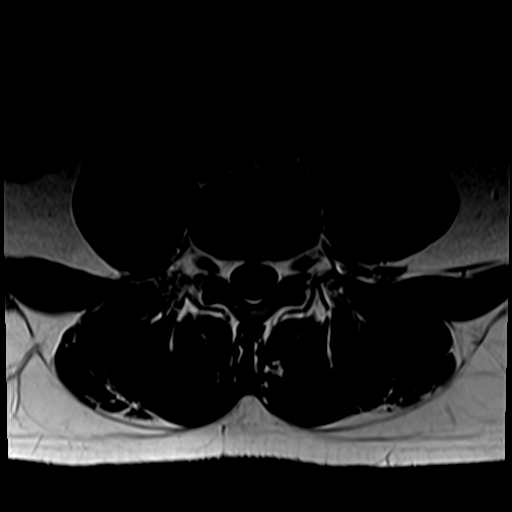
[im 17/38]
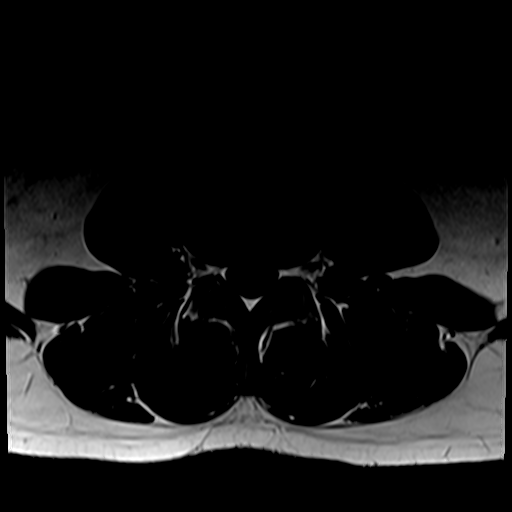
[im 21/38]
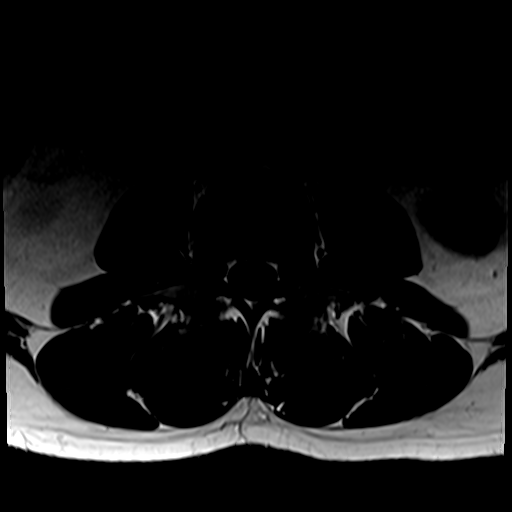
[im 25/38]
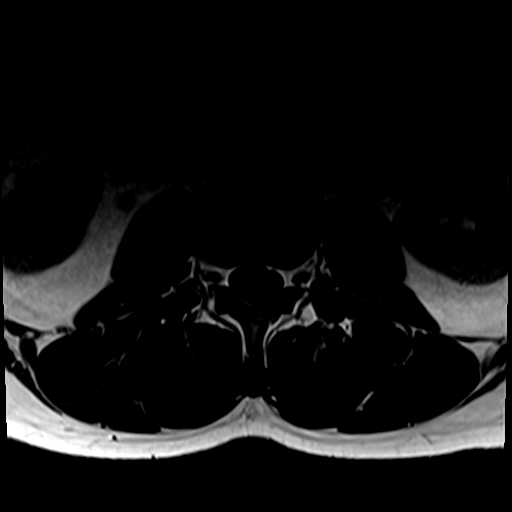
[im 33/38]
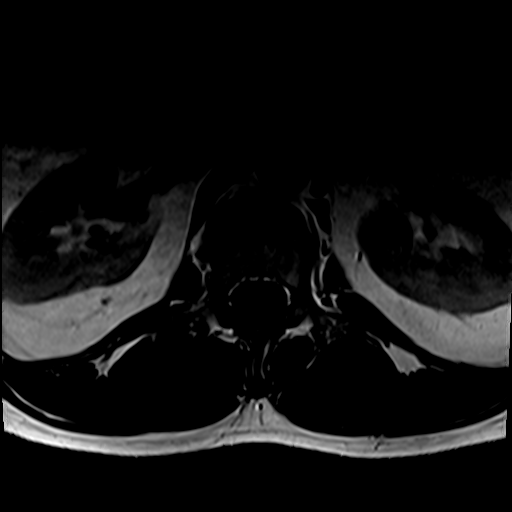
[im 38/38]
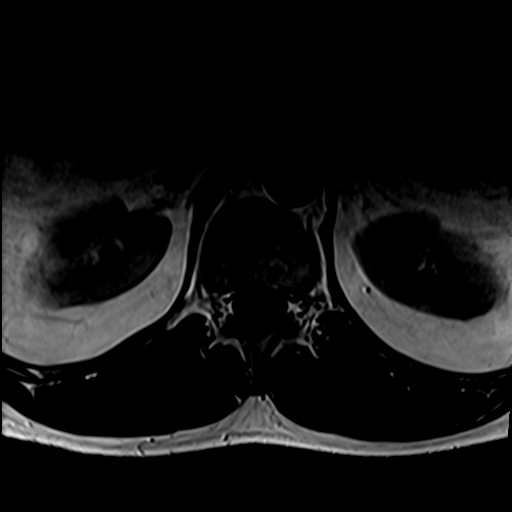

[Series 10: T1 fat-sat post-contrast · sagittal · 4.0mm · 0.81mm/px · 4 of 17 slices shown]
[im 1/17]
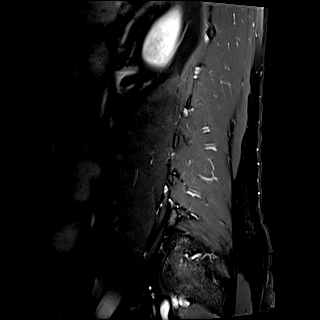
[im 6/17]
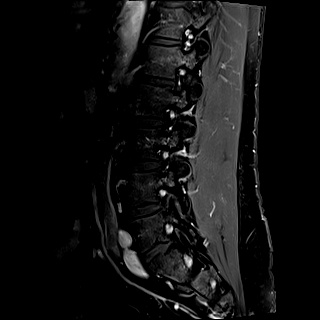
[im 11/17]
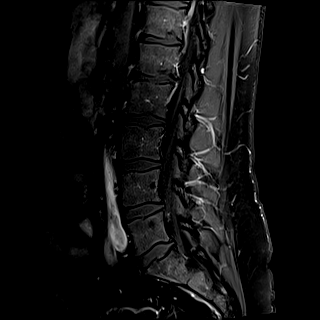
[im 17/17]
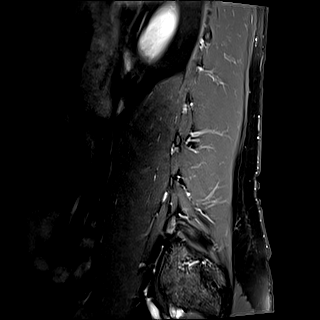

[32 of 48 positions shown; findings below may reference images not displayed]

FINDINGS: Segmentation:  Standard.

Alignment:  Physiologic.

Vertebrae: No fracture, evidence of discitis, or bone lesion. Small
Schmorl nodes are seen throughout the lumbar spine. No focus of
abnormal contrast enhancement.

Conus medullaris and cauda equina: Conus extends to the L1 level.
Conus and cauda equina appear normal.

Paraspinal and other soft tissues: Small left renal cyst.

Disc levels:

T12-L1: No spinal canal or neural foraminal stenosis.

L1-2: Small right posterolateral disc protrusion with annular tear
causing minimal indentation on the thecal sac. No spinal canal or
neural foraminal stenosis.

L2-3: Tiny left posterolateral disc protrusion causing minimal
indentation of the thecal sac without significant spinal canal or
neural foraminal stenosis.

L3-4: No spinal canal or neural foraminal stenosis.

L4-5: Minimal disc bulge. No spinal canal or neural foraminal
stenosis.

L5-S1: No spinal canal or neural foraminal stenosis.
IMPRESSION: Minimal degenerative changes of the lumbar spine without significant
spinal canal or neural foraminal stenosis at any level.

## 2023-05-02 ENCOUNTER — Encounter: Payer: Self-pay | Admitting: Urology

## 2023-05-16 ENCOUNTER — Encounter: Payer: Self-pay | Admitting: Urology

## 2023-05-30 ENCOUNTER — Other Ambulatory Visit: Payer: Self-pay | Admitting: *Deleted

## 2023-05-30 ENCOUNTER — Encounter: Payer: Self-pay | Admitting: Urology

## 2023-05-30 ENCOUNTER — Ambulatory Visit (INDEPENDENT_AMBULATORY_CARE_PROVIDER_SITE_OTHER): Payer: Self-pay | Admitting: Urology

## 2023-05-30 ENCOUNTER — Other Ambulatory Visit
Admission: RE | Admit: 2023-05-30 | Discharge: 2023-05-30 | Disposition: A | Discharge status: Home / Self Care | Payer: 59 | Attending: Urology | Admitting: Urology

## 2023-05-30 VITALS — BP 143/89 | HR 78 | Ht 68.0 in | Wt 185.0 lb

## 2023-05-30 DIAGNOSIS — N400 Enlarged prostate without lower urinary tract symptoms: Secondary | ICD-10-CM

## 2023-05-30 DIAGNOSIS — N529 Male erectile dysfunction, unspecified: Secondary | ICD-10-CM

## 2023-05-30 DIAGNOSIS — M6289 Other specified disorders of muscle: Secondary | ICD-10-CM

## 2023-05-30 DIAGNOSIS — N50819 Testicular pain, unspecified: Secondary | ICD-10-CM

## 2023-05-30 DIAGNOSIS — R3915 Urgency of urination: Secondary | ICD-10-CM

## 2023-05-30 LAB — URINALYSIS, COMPLETE (UACMP) WITH MICROSCOPIC
Bilirubin Urine: NEGATIVE
Glucose, UA: NEGATIVE mg/dL
Hgb urine dipstick: NEGATIVE
Ketones, ur: NEGATIVE mg/dL
Leukocytes,Ua: NEGATIVE
Nitrite: NEGATIVE
Protein, ur: NEGATIVE mg/dL
RBC / HPF: NONE SEEN RBC/hpf (ref 0–5)
Specific Gravity, Urine: 1.025 (ref 1.005–1.030)
Squamous Epithelial / HPF: NONE SEEN /[HPF] (ref 0–5)
pH: 5.5 (ref 5.0–8.0)

## 2023-05-30 LAB — BLADDER SCAN AMB NON-IMAGING

## 2023-05-30 MED ORDER — CELECOXIB 200 MG PO CAPS: 200.0000 mg | ORAL_CAPSULE | Freq: Two times a day (BID) | ORAL | 0 refills | Status: DC

## 2023-05-30 MED ORDER — TADALAFIL 5 MG PO TABS: 5.0000 mg | ORAL_TABLET | Freq: Every day | ORAL | 11 refills | Status: DC | PRN

## 2023-05-30 NOTE — Patient Instructions (Signed)
Pelvic Floor Dysfunction, Male     Pelvic floor dysfunction (PFD) is a condition that results when the group of muscles and connective tissues that support the organs in the pelvis (pelvic floor muscles) do not work well. These muscles and their connections form a sling that supports the colon and bladder. In men, these muscles also support the prostate gland. PFD causes pelvic floor muscles to be too weak, too tight, or both. In PFD, muscle movements are not coordinated. This may cause bowel or bladder problems. It may also cause pain. What are the causes? This condition may be caused by an injury to the pelvic area or by a weakening of pelvic muscles. In many cases, the exact cause is not known. What increases the risk? The following factors may make you more likely to develop PFD: Having chronic bladder tissue inflammation (interstitial cystitis). Being an older person. Being overweight. History of radiation treatment for cancer in the pelvic region. Previous pelvic surgery, such as removal of the prostate gland (prostatectomy). What are the signs or symptoms? Symptoms of this condition vary and may include: Bladder symptoms, such as: Trouble starting urination and emptying the bladder. Frequent urinary tract infections. Leaking urine when coughing, laughing, or exercising (stress incontinence). Having to pass urine urgently or frequently. Pain when passing urine. Bowel symptoms, such as: Constipation. Urgent or frequent bowel movements. Incomplete bowel movements. Painful bowel movements. Leaking stool or gas. Unexplained genital or rectal pain. Genital or rectal muscle spasms. Low back pain. Sexual dysfunction, such as erectile dysfunction, premature ejaculation, or pain during or after sexual activity. How is this diagnosed? This condition is diagnosed based on: Your symptoms and medical history. A physical exam. During the exam, your health care provider may check your  pelvic muscles for tightness, spasm, pain, or weakness. This may include a rectal exam. In some cases, you may have diagnostic tests, such as: Electrical muscle function tests. Urine flow testing. X-ray tests of bowel function. Ultrasound of the pelvic organs. How is this treated? Treatment for this condition depends on your symptoms. Treatment options include: Physical therapy. This may include Kegel exercises to help relax or strengthen the pelvic floor muscles. Biofeedback. This type of therapy provides feedback on how tight your pelvic floor muscles are so that you can learn to control them. Massage therapy. A treatment that involves electrical stimulation of the pelvic floor muscles to help control pain (transcutaneous electrical nerve stimulation, or TENS). Sound wave therapy (ultrasound) to reduce muscle spasms. Medicines, such as: Muscle relaxants. Bladder control medicines. Surgery to reconstruct or support pelvic floor muscles may be an option if other treatments do not help. Follow these instructions at home: Activity Do your usual activities as told by your health care provider. Ask your health care provider if you should modify any activities. Do pelvic floor strengthening or relaxing exercises at home as told by your physical therapist. Lifestyle Maintain a healthy weight. Eat foods that are high in fiber, such as beans, whole grains, and fresh fruits and vegetables. Limit foods that are high in fat and processed sugars, such as fried or sweet foods. Manage stress with relaxation techniques such as yoga or meditation. General instructions If you have problems with leakage: Use absorbable pads or wear padded underwear. Wash your genital and anal area frequently with mild soap. Keep your genital and anal area as clean and dry as possible. Ask your health care provider if you should try a barrier cream to prevent skin irritation. Take warm  baths to relieve pelvic muscle  tension or spasms. Take over-the-counter and prescription medicines only as told by your health care provider. Keep all follow-up visits. How is this prevented? The cause of PFD is not always known, but there are a few things you can do to reduce the risk of developing this condition, including: Staying at a healthy weight. Getting regular exercise. Managing stress. Contact a health care provider if: Your symptoms are not improving with home care. You have signs or symptoms of PFD that get worse. You develop new signs or symptoms. You have signs of a urinary tract infection, such as: Fever. Chills. Increased urinary frequency. A burning feeling when urinating. You have not had a bowel movement in 3 days (constipation). Summary Pelvic floor dysfunction results when the muscles and connective tissues in your pelvic floor do not work well. These muscles and their connections form a sling that supports your colon and bladder. In men, these muscles also support the prostate gland. PFD may be caused by an injury to the pelvic area or by a weakening of pelvic muscles. PFD causes pelvic floor muscles to be too weak, too tight, or a combination of both. Symptoms may vary from person to person. In most cases, PFD can be treated with physical therapies and medicines. Surgery may be an option if other treatments do not help. This information is not intended to replace advice given to you by your health care provider. Make sure you discuss any questions you have with your health care provider. Document Revised: 10/28/2020 Document Reviewed: 10/28/2020 Elsevier Patient Education  2024 [employer].

## 2023-05-30 NOTE — Progress Notes (Signed)
   05/30/23 2:45 PM   Carl Mccarty 04-09-1981 [id number]  CC: Left groin pain, urinary symptoms, ED, PSA screening  HPI: 42 year old male previously followed by Dr. Sheppard Penton with a long history of intermittent left-sided groin pain, urinary symptoms and ED.  The outside records are not available to me.  He has had extensive imaging including a MRI of the lumbar spine in June 2023 that was benign, and a CT abdomen and pelvis with contrast and a scrotal ultrasound at [facility] in May 2023 also benign.  In terms of his groin pain, this seems to be better with physical activity and worse with resting.  His pain is primarily in the left groin crease as opposed to the testicle.  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  His primary urinary symptoms are urgency and frequency.  Urinalysis today is completely benign, PVR is normal at 2ml.  He denies any gross hematuria.  He has been on multiple courses of antibiotics for possible " prostatitis," he denies significant improvement on antibiotics previously.  This  In terms of his ED, he has been on Cialis 5 mg as needed which is helpful.  He thinks his groin pain initially started after he had a vasectomy with Dr. Evelene Croon.  PSA from November 2024 normal at 0.7  PMH: Past Medical History:  Diagnosis Date   GERD (gastroesophageal reflux disease)     Surgical History: Past Surgical History:  Procedure Laterality Date   FRACTURE SURGERY      Family History: Family History  Problem Relation Age of Onset   Breast cancer Mother    Dementia Father     Social History:  reports that he has never smoked. He has never been exposed to tobacco smoke. He has never used smokeless tobacco. He reports that he does not drink alcohol and does not use drugs.  Physical Exam: BP (!) 143/89   Pulse 78   Ht 5\' 8"  (1.727 m)   Wt 185 lb (83.9 kg)   BMI 28.13 kg/m    Constitutional:  Alert and  oriented, No acute distress. Cardiovascular: No clubbing, cyanosis, or edema. Respiratory: Normal respiratory effort, no increased work of breathing. GI: Abdomen is soft, nontender, nondistended, no abdominal masses GU: Phallus with patent meatus, no lesions, testicles 20 cc in center bilaterally without masses, no testicular tenderness, mild tenderness to the left inner groin posterior crease  Laboratory Data: Reviewed, see HPI  Pertinent Imaging: I have personally viewed and interpreted the prior MRI and CT scans with no abnormalities.  Assessment & Plan:   42 year old male with urinary symptoms of urgency and frequency, left-sided groin pain, and ED.  Has had extensive workup with CT, MRI, and scrotal ultrasound that have been benign, urinalysis benign and emptying well with normal PVR.  Constellation of symptoms are most consistent with pelvic floor dysfunction.    I recommended starting with a course of Celebrex 200 mg twice daily x 10 days with pelvic floor stretching exercises.  Referral placed to pelvic floor physical therapy.  If no improvement could consider trial of amitriptyline.  RTC 6 weeks symptom check  Legrand Rams, MD 05/30/2023  [facility] [address], [address] [place], [place] [address] [phone]

## 2023-07-04 ENCOUNTER — Ambulatory Visit: Payer: 59 | Admitting: Urology

## 2023-07-04 ENCOUNTER — Encounter: Payer: Self-pay | Admitting: Urology

## 2023-07-04 VITALS — BP 144/91 | HR 73 | Ht 68.0 in | Wt 189.0 lb

## 2023-07-04 DIAGNOSIS — R102 Pelvic and perineal pain: Secondary | ICD-10-CM | POA: Diagnosis not present

## 2023-07-04 DIAGNOSIS — R399 Unspecified symptoms and signs involving the genitourinary system: Secondary | ICD-10-CM

## 2023-07-04 NOTE — Progress Notes (Signed)
   07/04/2023 12:57 PM   Carl Mccarty 1981-05-19 [id number]  Reason for visit: Follow up groin pain, urinary symptoms, ED  HPI: 42 year old male previously followed by Dr. Gala who developed left-sided groin pain and urinary symptoms of urgency, and frequency that he thinks correlated with his vasectomy.  He has had extensive workup including a normal CT, MRI, and scrotal ultrasound ordered by outside providers.  At our initial visit in November 2024 I recommended a 10-day course of Celebrex  and pelvic floor stretching exercises, referral was also placed to pelvic floor PT.  He reports significant improvement in his groin pain after the Celebrex  and stretching/strengthening exercises provided.  He also has been working with a land which he feels is very helpful.  He denies any significant urinary symptoms today.  Overall, he feels his primary issue is sciatica at this time.  I encouraged him to consider physical therapy in the future if worsening symptoms.  Reassurance provided.  Follow-up with urology as needed  Carl JAYSON Burnet, MD  [facility] [address] [place], [place] [address] [phone]

## 2024-03-12 ENCOUNTER — Other Ambulatory Visit: Payer: Self-pay

## 2024-03-12 ENCOUNTER — Other Ambulatory Visit
Admission: RE | Admit: 2024-03-12 | Discharge: 2024-03-12 | Disposition: A | Discharge status: Home / Self Care | Attending: Urology | Admitting: Urology

## 2024-03-12 ENCOUNTER — Ambulatory Visit (INDEPENDENT_AMBULATORY_CARE_PROVIDER_SITE_OTHER): Admitting: Urology

## 2024-03-12 VITALS — BP 144/99 | HR 78 | Wt 184.2 lb

## 2024-03-12 DIAGNOSIS — R399 Unspecified symptoms and signs involving the genitourinary system: Secondary | ICD-10-CM | POA: Diagnosis not present

## 2024-03-12 DIAGNOSIS — N529 Male erectile dysfunction, unspecified: Secondary | ICD-10-CM | POA: Diagnosis not present

## 2024-03-12 DIAGNOSIS — N50819 Testicular pain, unspecified: Secondary | ICD-10-CM | POA: Diagnosis present

## 2024-03-12 DIAGNOSIS — R102 Pelvic and perineal pain: Secondary | ICD-10-CM | POA: Diagnosis not present

## 2024-03-12 LAB — URINALYSIS, COMPLETE (UACMP) WITH MICROSCOPIC
Bilirubin Urine: NEGATIVE
Glucose, UA: NEGATIVE mg/dL
Ketones, ur: NEGATIVE mg/dL
Leukocytes,Ua: NEGATIVE
Nitrite: NEGATIVE
Protein, ur: NEGATIVE mg/dL
Specific Gravity, Urine: 1.025 (ref 1.005–1.030)
pH: 6 (ref 5.0–8.0)

## 2024-03-12 MED ORDER — CELECOXIB 200 MG PO CAPS: 200.0000 mg | ORAL_CAPSULE | Freq: Two times a day (BID) | ORAL | 0 refills | Status: DC

## 2024-03-12 NOTE — Progress Notes (Signed)
   03/12/2024 1:08 PM   Carl Mccarty [birth year] [id number]  Reason for visit: Left scrotal pain, ED, urinary symptoms  History: Originally seen by me November 2024 for left-sided groin pain and urinary symptoms that he felt started after his vasectomy with Dr. Gala Extensive prior negative workup including normal CT, MRI, scrotal ultrasound Previously had improvement with Celebrex , pelvic floor stretching exercises  Physical Exam: BP (!) 144/99 (BP Location: Left Arm, Patient Position: Sitting, Cuff Size: Normal)   Pulse 78   Wt 184 lb 3.2 oz (83.6 kg)   SpO2 98%   BMI 28.01 kg/m  Testicles 20 cc and descended bilaterally without masses, mild left epididymal tenderness  Imaging/labs: Urinalysis today benign PSA normal November 2024 0.7  Today: Recently developed worsening urinary symptoms and left groin pain, was treated by PCP with long course of antibiotics and Flomax for possible prostatitis Not sure he has had significant proved after those medications, primary issue today is left-sided groin and scrotal pain and low energy Cialis  moderately effective for ED  Plan:   His symptoms are more consistent with pelvic floor dysfunction as opposed to infection, extensive prior negative imaging workup and infectious workup Recommended pelvic floor stretches and information provided, course of Celebrex  Recommended Cialis  5 mg daily to potentially help with both urinary symptoms and ED Will check morning testosterone based on his complaint in the ED and low energy, replacement strategies discussed Consider amitriptyline or referral for spermatic cord denervation in the future   Carl JAYSON Burnet, MD  [facility] [address], [address] [place], [place] [address] [phone]

## 2024-03-12 NOTE — Patient Instructions (Signed)
 Carl Mccarty

## 2024-04-04 ENCOUNTER — Other Ambulatory Visit
Admission: RE | Admit: 2024-04-04 | Discharge: 2024-04-04 | Disposition: A | Discharge status: Home / Self Care | Attending: Urology | Admitting: Urology

## 2024-04-04 DIAGNOSIS — N529 Male erectile dysfunction, unspecified: Secondary | ICD-10-CM | POA: Insufficient documentation

## 2024-04-05 LAB — TESTOSTERONE: Testosterone: 257 ng/dL — ABNORMAL LOW (ref 264–916)

## 2024-04-07 NOTE — Progress Notes (Deleted)
 04/08/2024 4:44 PM   Carl Mccarty [birth year] [id number]  Referring provider: Eliverto Bette Hover, MD [address] [place],  [place] [address]  Urological history: 1. Pelvic pain - contrast CT (06/2020) no significant urological findings - Lumbar MRI (12/2021) minimal degenerative changes of lumbar spine  2. Undesired fertility - vas ***  3. ED - tadalafil  5 mg daily  - testosterone level 257 after 10 am   No chief complaint on file.  HPI: Carl Mccarty is a 43 y.o. man who presents today for follow up.  Previous records reviewed.  He was last seen by Dr. Francisca on September 9, for worsening urinary symptoms and left groin pain which had been treated by PCP with long course of antibiotics and tamsulosin.  He felt he did not have any significant improvement and continued to have left-sided groin and scrotal pain and also was experiencing low energy.  Tadalafil  was moderately effective for ED.  He was given a short course of Celebrex , recommended half tadalafil  5 mg daily and a testosterone was checked that day.  The testosterone returned below 300, but it was drawn after 10 AM.  PSA (05/2023) 0.74  Serum creatinine (05/2023) 0.9, eGFR 86  PMH: Past Medical History:  Diagnosis Date   GERD (gastroesophageal reflux disease)     Surgical History: Past Surgical History:  Procedure Laterality Date   FRACTURE SURGERY      Home Medications:  Allergies as of 04/08/2024       Reactions   Oxycodone Rash        Medication List        Accurate as of April 07, 2024  4:44 PM. If you have any questions, ask your nurse or doctor.          buPROPion 300 MG 24 hr tablet Commonly known as: WELLBUTRIN XL Take 1 tablet by mouth daily.   busPIRone 7.5 MG tablet Commonly known as: BUSPAR Take 1 tablet by mouth 2 (two) times daily.   celecoxib  200 MG capsule Commonly known as: CeleBREX  Take 1 capsule (200 mg total) by mouth 2 (two) times daily.    celecoxib  200 MG capsule Commonly known as: CeleBREX  Take 1 capsule (200 mg total) by mouth 2 (two) times daily.   omeprazole 40 MG capsule Commonly known as: PRILOSEC Take 40 mg by mouth daily.   tadalafil  5 MG tablet Commonly known as: CIALIS  Take 1 tablet (5 mg total) by mouth daily as needed for erectile dysfunction (take 45 minutes prior to sexual activity).        Allergies:  Allergies  Allergen Reactions   Oxycodone Rash    Family History: Family History  Problem Relation Age of Onset   Breast cancer Mother    Dementia Father     Social History:  reports that he has never smoked. He has never been exposed to tobacco smoke. He has never used smokeless tobacco. He reports that he does not drink alcohol and does not use drugs.  ROS: Pertinent ROS in HPI  Physical Exam: There were no vitals taken for this visit.  Constitutional:  Well nourished. Alert and oriented, No acute distress. HEENT: [place] AT, moist mucus membranes.  Trachea midline, no masses. Cardiovascular: No clubbing, cyanosis, or edema. Respiratory: Normal respiratory effort, no increased work of breathing. GI: Abdomen is soft, non tender, non distended, no abdominal masses. Liver and spleen not palpable.  No hernias appreciated.  Stool sample for occult testing is not indicated.  GU: No CVA tenderness.  No bladder fullness or masses.  Patient with circumcised/uncircumcised phallus. ***Foreskin easily retracted***  Urethral meatus is patent.  No penile discharge. No penile lesions or rashes. Scrotum without lesions, cysts, rashes and/or edema.  Testicles are located scrotally bilaterally. No masses are appreciated in the testicles. Left and right epididymis are normal. Rectal: Patient with  normal sphincter tone. Anus and perineum without scarring or rashes. No rectal masses are appreciated. Prostate is approximately *** grams, *** nodules are appreciated. Seminal vesicles are normal. Skin: No rashes,  bruises or suspicious lesions. Lymph: No cervical or inguinal adenopathy. Neurologic: Grossly intact, no focal deficits, moving all 4 extremities. Psychiatric: Normal mood and affect.  Laboratory Data: See Epic and HPI I have reviewed the labs.   Pertinent Imaging: N/A  Assessment & Plan:  ***  1. Low testosterone - Explained that testosterone peaks in the morning and therefore we need to have 2 morning testosterone's before 10 AM at least 2 days apart for confirmation in order to diagnose hypogonadism - schedule lab appointments  2. ED - ***  3. Chronic pelvic pain - discussed multifactorial nature of CP/CPPS (urological, neurological, musculoskeletal, psychosocial) - start empiric alpha-blocker - consider referral to pelvic floor physical therapy - Trial of NSAIDS as needed - warm sitz baths recommended - discussed stress-reduction techniques and possible role of cognitive behavioral therapy if mood symptoms present - urinalysis and urine culture ordered to rule out occult infection - follow up in 6 weeks to assess response; consider further workup (e.g., cystoscopy, pelvic ultrasound, or MRI) if no improvement    No follow-ups on file.  These notes generated with voice recognition software. I apologize for typographical errors.  CLOTILDA HELON RIGGERS  [facility] [address]  [address] [place], [place] [address] [phone]

## 2024-04-08 ENCOUNTER — Ambulatory Visit: Admitting: Urology

## 2024-04-08 DIAGNOSIS — R102 Pelvic and perineal pain unspecified side: Secondary | ICD-10-CM

## 2024-04-08 DIAGNOSIS — R7989 Other specified abnormal findings of blood chemistry: Secondary | ICD-10-CM

## 2024-04-08 DIAGNOSIS — N529 Male erectile dysfunction, unspecified: Secondary | ICD-10-CM

## 2024-04-10 NOTE — Progress Notes (Unsigned)
 04/11/2024 2:28 PM   Carl Mccarty 04/20/1981 [id number]  Referring provider: Eliverto Bette Hover, MD [address] [place],  [place] [address]  Urological history: 1. Pelvic pain - contrast CT (06/2020) no significant urological findings - Lumbar MRI (12/2021) minimal degenerative changes of lumbar spine  2. Undesired fertility - vas ~ ten years ago   3. ED - tadalafil  5 mg daily  - testosterone level 257 after 10 am   Chief Complaint  Patient presents with   Follow-up   Hypogonadism   HPI: Carl Mccarty is a 43 y.o. man who presents today for follow up.  Previous records reviewed.  He was last seen by Dr. Francisca on September 9, for worsening urinary symptoms and left groin pain which had been treated by PCP with long course of antibiotics and tamsulosin.  He felt he did not have any significant improvement and continued to have left-sided groin and scrotal pain and also was experiencing low energy.  Tadalafil  was moderately effective for ED.  He was given a short course of Celebrex , recommended tadalafil  5 mg daily and a testosterone was checked that day.  The testosterone returned below 300, but it was drawn after 10 AM.  He continues to report low energy and low libido.  He states that the pelvic floor stretches and the course of Celebrex  and the daily Cialis  did not address his testicular pain.  He states the pain has been present since he underwent his vasectomy about 10 years ago.  He states that during the vasectomy, he did not experience any discomfort when they were doing the right, but when they were doing his left side, he states he felt the entire procedure and it was very painful.  He states that now he has a constant left-sided testicular pain that is 3-4 and it is made worse by ejaculating.  After ejaculation, the pain goes to 5-6.  He is no longer having spontaneous erections, he is not in any painful erections and he denies any curvature to  erections.  He also has not noted any blood in his ejaculate fluid.  PSA (05/2023) 0.74  Serum creatinine (05/2023) 0.9, eGFR 86  He is pretty frustrated with the left testicular pain as it has been longstanding.  He states they have ruled out any MSK etiology for the pain and although different types of medications and exercises they have given him have not provided relief.  PMH: Past Medical History:  Diagnosis Date   GERD (gastroesophageal reflux disease)     Surgical History: Past Surgical History:  Procedure Laterality Date   FRACTURE SURGERY      Home Medications:  Allergies as of 04/11/2024       Reactions   Oxycodone Rash        Medication List        Accurate as of April 11, 2024  2:28 PM. If you have any questions, ask your nurse or doctor.          amoxicillin-clavulanate 875-125 MG tablet Commonly known as: AUGMENTIN Take 1 tablet by mouth 2 (two) times daily.   buPROPion 300 MG 24 hr tablet Commonly known as: WELLBUTRIN XL Take 1 tablet by mouth daily.   busPIRone 7.5 MG tablet Commonly known as: BUSPAR Take 1 tablet by mouth 2 (two) times daily.   celecoxib  200 MG capsule Commonly known as: CeleBREX  Take 1 capsule (200 mg total) by mouth 2 (two) times daily.   celecoxib  200 MG capsule Commonly  known as: CeleBREX  Take 1 capsule (200 mg total) by mouth 2 (two) times daily.   omeprazole 40 MG capsule Commonly known as: PRILOSEC Take 40 mg by mouth daily.   tadalafil  5 MG tablet Commonly known as: CIALIS  Take 1 tablet (5 mg total) by mouth daily as needed for erectile dysfunction (take 45 minutes prior to sexual activity).        Allergies:  Allergies  Allergen Reactions   Oxycodone Rash    Family History: Family History  Problem Relation Age of Onset   Breast cancer Mother    Dementia Father     Social History:  reports that he has never smoked. He has never been exposed to tobacco smoke. He has never used smokeless  tobacco. He reports that he does not drink alcohol and does not use drugs.  ROS: Pertinent ROS in HPI  Physical Exam: BP (!) 149/87 (BP Location: Left Arm, Patient Position: Sitting, Cuff Size: Normal)   Pulse 99   Ht 5' 8 (1.727 m)   Wt 183 lb (83 kg)   SpO2 96%   BMI 27.83 kg/m   Constitutional:  Well nourished. Alert and oriented, No acute distress. HEENT: [place] AT, moist mucus membranes.  Trachea midline Cardiovascular: No clubbing, cyanosis, or edema. Respiratory: Normal respiratory effort, no increased work of breathing. Neurologic: Grossly intact, no focal deficits, moving all 4 extremities. Psychiatric: Normal mood and affect.  Laboratory Data: See Epic and HPI I have reviewed the labs.   Pertinent Imaging: N/A  Assessment & Plan:    1. Low testosterone - Explained that testosterone peaks in the morning and therefore we need to have 2 morning testosterone's before 10 AM at least 2 days apart for confirmation in order to diagnose hypogonadism - schedule lab appointments-he will go to the [facility] lab which is walk-in  2. ED - continue tadalafil  5 mg daily  3. Chronic testicular pain - He has found no relief with Celebrex , daily Cialis  or pelvic floor stretches -He would like to see if he would be a candidate for spermatic cord denervation as discussed at his previous appointment with Dr. Francisca - I will go ahead and place referral for him to see Dr. Lovie at [facility] urological   Return for Follow up pending labs.  These notes generated with voice recognition software. I apologize for typographical errors.  CLOTILDA HELON RIGGERS  [facility] [address]  [address] [place], [place] [address] [phone]

## 2024-04-11 ENCOUNTER — Ambulatory Visit: Admitting: Urology

## 2024-04-11 ENCOUNTER — Encounter: Payer: Self-pay | Admitting: Urology

## 2024-04-11 VITALS — BP 149/87 | HR 99 | Ht 68.0 in | Wt 183.0 lb

## 2024-04-11 DIAGNOSIS — R102 Pelvic and perineal pain unspecified side: Secondary | ICD-10-CM

## 2024-04-11 DIAGNOSIS — N50812 Left testicular pain: Secondary | ICD-10-CM

## 2024-04-11 DIAGNOSIS — N529 Male erectile dysfunction, unspecified: Secondary | ICD-10-CM

## 2024-04-11 DIAGNOSIS — R7989 Other specified abnormal findings of blood chemistry: Secondary | ICD-10-CM | POA: Diagnosis not present

## 2024-04-15 ENCOUNTER — Other Ambulatory Visit
Admission: RE | Admit: 2024-04-15 | Discharge: 2024-04-15 | Disposition: A | Discharge status: Home / Self Care | Attending: Urology | Admitting: Urology

## 2024-04-15 DIAGNOSIS — R7989 Other specified abnormal findings of blood chemistry: Secondary | ICD-10-CM | POA: Diagnosis not present

## 2024-04-15 DIAGNOSIS — N529 Male erectile dysfunction, unspecified: Secondary | ICD-10-CM | POA: Diagnosis present

## 2024-04-16 ENCOUNTER — Ambulatory Visit: Payer: Self-pay | Admitting: Urology

## 2024-04-16 LAB — TESTOSTERONE: Testosterone: 291 ng/dL (ref 264–916)

## 2024-04-21 DIAGNOSIS — S0181XA Laceration without foreign body of other part of head, initial encounter: Secondary | ICD-10-CM | POA: Insufficient documentation

## 2024-04-21 NOTE — Progress Notes (Deleted)
 04/22/2024 7:38 PM   Carl Mccarty 05/04/1981 [id number]  Referring provider: Eliverto Bette Hover, MD [address] [place],  [place] [address]  Urological history: 1. Pelvic pain - contrast CT (06/2020) no significant urological findings - Lumbar MRI (12/2021) minimal degenerative changes of lumbar spine  2. Undesired fertility - vas ~ ten years ago   3. ED - tadalafil  5 mg daily  - testosterone level 257 after 10 am   No chief complaint on file.  HPI: Carl Mccarty is a 43 y.o. man who presents today to discuss testosterone replacement therapy.  Previous records reviewed.  He has been experiencing reduced energy, reduced endurance, diminished work performance, diminished physical performance, loss of body hair, reduced beard growth, fatigue, reduced lean muscle mass and obesity.  He has also noticed depressive symptoms, cognitive dysfunction, reduced motivation, poor concentration, poor memory and irritability.  There is also been a decreased in his libido and issues with erectile dysfunction.  PMH: Past Medical History:  Diagnosis Date   GERD (gastroesophageal reflux disease)     Surgical History: Past Surgical History:  Procedure Laterality Date   FRACTURE SURGERY      Home Medications:  Allergies as of 04/22/2024       Reactions   Oxycodone Rash        Medication List        Accurate as of April 21, 2024  7:38 PM. If you have any questions, ask your nurse or doctor.          buPROPion 300 MG 24 hr tablet Commonly known as: WELLBUTRIN XL Take 1 tablet by mouth daily.   busPIRone 7.5 MG tablet Commonly known as: BUSPAR Take 1 tablet by mouth 2 (two) times daily.   celecoxib  200 MG capsule Commonly known as: CeleBREX  Take 1 capsule (200 mg total) by mouth 2 (two) times daily.   celecoxib  200 MG capsule Commonly known as: CeleBREX  Take 1 capsule (200 mg total) by mouth 2 (two) times daily.   omeprazole 40 MG capsule Commonly  known as: PRILOSEC Take 40 mg by mouth daily.   tadalafil  5 MG tablet Commonly known as: CIALIS  Take 1 tablet (5 mg total) by mouth daily as needed for erectile dysfunction (take 45 minutes prior to sexual activity).        Allergies:  Allergies  Allergen Reactions   Oxycodone Rash    Family History: Family History  Problem Relation Age of Onset   Breast cancer Mother    Dementia Father     Social History:  reports that he has never smoked. He has never been exposed to tobacco smoke. He has never used smokeless tobacco. He reports that he does not drink alcohol and does not use drugs.  ROS: Pertinent ROS in HPI  Physical Exam: There were no vitals taken for this visit.  Constitutional:  Well nourished. Alert and oriented, No acute distress. HEENT: [place] AT, moist mucus membranes.  Trachea midline, no masses. Cardiovascular: No clubbing, cyanosis, or edema. Respiratory: Normal respiratory effort, no increased work of breathing. Neurologic: Grossly intact, no focal deficits, moving all 4 extremities. Psychiatric: Normal mood and affect.   Laboratory Data: See Epic and HPI I have reviewed the labs.   Pertinent Imaging: N/A  Assessment & Plan:    1. Hypogonadism  - explained that the diagnosis of testosterone deficiency/hypogonadism requires two morning testosterones at least two days apart below 300 to meet criteria, which he has met  - explained some insurances  also require free and total testosterone, SHBG, FH/LH and prolactin levels as well  - explained some insurances will even require weight loss, managing diabetes, high blood pressure, sleep apnea and liver disease prior to covering testosterone therapy - explained that TRT is not a treatment for ED, he may see some improvement in his erections, but his ED will likely persist even with therapeutic levels of testosterone - discussed potential side effects of testosterone replacement  including stimulation of  erythrocytosis; edema; gynecomastia; worsening sleep apnea; venous thromboembolism; testicular atrophy and infertility.   The theoretical risk of growth stimulation of an undetected prostate cancer was also discussed.  He was informed that current evidence does not provide any definitive answers regarding the risks of testosterone therapy on prostate cancer and cardiovascular disease. The need for periodic monitoring of his testosterone level, PSA, hematocrit and DRE was discussed.  This monitoring will be conducted every three months during the first year of TRT and then every 6 months if blood work remains stable, if there is an abnormality found in follow up blood work, it will result in the monitoring of blood work more frequently  - advised that any missed or delayed appointments will also result in delays in the refilling of the TRT as it is a controlled substance - advised that the office requires one week to refill TRT - Near castrate testosterone levels*** - Significant symptoms***feeling depressed or tired, having little to no interest in sex, low energy and weak muscles or bones - Recommend starting TRT*** - We discussed the most common forms of replacement including intramuscular injection and gels and he desires to start injections*** - Rx testosterone cypionate-200 mg every 2 weeks to start*** - Appointment will be made for injection training*** - Follow-up 5 weeks after starting TRT for testosterone level and symptom check*** - discussed that we follow guidelines for age appropriate testosterone levels to decide on dosing regimens for TRT and this is based on current agreements in the medical community and we will not deviate from this unless there is good scientific data to do so [employer] may not recognize the parameters we use to determine that the patient has low testosterone and therefore, if they want to pursue TRT, it will have be out-of-pocket   - 40-49 ([id number])  (5.3-26.3)    2. ED - continue tadalafil  5 mg daily  3. Chronic testicular pain - He has found no relief with Celebrex , daily Cialis  or pelvic floor stretches -He would like to see if he would be a candidate for spermatic cord denervation as discussed at his previous appointment with Dr. Francisca - I will go ahead and place referral for him to see Dr. Lovie at [facility] urological ***  No follow-ups on file.  These notes generated with voice recognition software. I apologize for typographical errors.  CLOTILDA HELON RIGGERS  [facility] [address]  [address] [place], [place] [address] [phone]

## 2024-04-22 ENCOUNTER — Other Ambulatory Visit: Payer: Self-pay

## 2024-04-22 ENCOUNTER — Ambulatory Visit: Admitting: Urology

## 2024-04-22 DIAGNOSIS — E291 Testicular hypofunction: Secondary | ICD-10-CM

## 2024-04-22 DIAGNOSIS — N529 Male erectile dysfunction, unspecified: Secondary | ICD-10-CM

## 2024-04-24 NOTE — Progress Notes (Deleted)
 04/25/2024 11:56 AM   Carl Mccarty 05/16/1981 [id number]  Referring provider: Eliverto Bette Hover, MD [address] [place],  [place] [address]  Urological history: 1. Pelvic pain - contrast CT (06/2020) no significant urological findings - Lumbar MRI (12/2021) minimal degenerative changes of lumbar spine  2. Undesired fertility - vas ~ ten years ago   3. ED - tadalafil  5 mg daily  - testosterone level 257 after 10 am   No chief complaint on file.  HPI: Carl Mccarty is a 43 y.o. man who presents today to discuss testosterone replacement therapy.  Previous records reviewed.  He has been experiencing reduced energy, reduced endurance, diminished work performance, diminished physical performance, loss of body hair, reduced beard growth, fatigue, reduced lean muscle mass and obesity.  He has also noticed depressive symptoms, cognitive dysfunction, reduced motivation, poor concentration, poor memory and irritability.  There is also been a decreased in his libido and issues with erectile dysfunction.  PMH: Past Medical History:  Diagnosis Date   GERD (gastroesophageal reflux disease)     Surgical History: Past Surgical History:  Procedure Laterality Date   FRACTURE SURGERY      Home Medications:  Allergies as of 04/25/2024       Reactions   Oxycodone Rash        Medication List        Accurate as of April 24, 2024 11:56 AM. If you have any questions, ask your nurse or doctor.          buPROPion 300 MG 24 hr tablet Commonly known as: WELLBUTRIN XL Take 1 tablet by mouth daily.   busPIRone 7.5 MG tablet Commonly known as: BUSPAR Take 1 tablet by mouth 2 (two) times daily.   celecoxib  200 MG capsule Commonly known as: CeleBREX  Take 1 capsule (200 mg total) by mouth 2 (two) times daily.   celecoxib  200 MG capsule Commonly known as: CeleBREX  Take 1 capsule (200 mg total) by mouth 2 (two) times daily.   omeprazole 40 MG capsule Commonly  known as: PRILOSEC Take 40 mg by mouth daily.   tadalafil  5 MG tablet Commonly known as: CIALIS  Take 1 tablet (5 mg total) by mouth daily as needed for erectile dysfunction (take 45 minutes prior to sexual activity).        Allergies:  Allergies  Allergen Reactions   Oxycodone Rash    Family History: Family History  Problem Relation Age of Onset   Breast cancer Mother    Dementia Father     Social History:  reports that he has never smoked. He has never been exposed to tobacco smoke. He has never used smokeless tobacco. He reports that he does not drink alcohol and does not use drugs.  ROS: Pertinent ROS in HPI  Physical Exam: There were no vitals taken for this visit.  Constitutional:  Well nourished. Alert and oriented, No acute distress. HEENT: [place] AT, moist mucus membranes.  Trachea midline, no masses. Cardiovascular: No clubbing, cyanosis, or edema. Respiratory: Normal respiratory effort, no increased work of breathing. Neurologic: Grossly intact, no focal deficits, moving all 4 extremities. Psychiatric: Normal mood and affect.   Laboratory Data: See Epic and HPI I have reviewed the labs.   Pertinent Imaging: N/A  Assessment & Plan:    1. Hypogonadism  - explained that the diagnosis of testosterone deficiency/hypogonadism requires two morning testosterones at least two days apart below 300 to meet criteria, which he has met  - explained some insurances also  require free and total testosterone, SHBG, FH/LH and prolactin levels as well  - explained some insurances will even require weight loss, managing diabetes, high blood pressure, sleep apnea and liver disease prior to covering testosterone therapy - explained that TRT is not a treatment for ED, he may see some improvement in his erections, but his ED will likely persist even with therapeutic levels of testosterone - discussed potential side effects of testosterone replacement  including stimulation of  erythrocytosis; edema; gynecomastia; worsening sleep apnea; venous thromboembolism; testicular atrophy and infertility.   The theoretical risk of growth stimulation of an undetected prostate cancer was also discussed.  He was informed that current evidence does not provide any definitive answers regarding the risks of testosterone therapy on prostate cancer and cardiovascular disease. The need for periodic monitoring of his testosterone level, PSA, hematocrit and DRE was discussed.  This monitoring will be conducted every three months during the first year of TRT and then every 6 months if blood work remains stable, if there is an abnormality found in follow up blood work, it will result in the monitoring of blood work more frequently  - advised that any missed or delayed appointments will also result in delays in the refilling of the TRT as it is a controlled substance - advised that the office requires one week to refill TRT - Near castrate testosterone levels*** - Significant symptoms***feeling depressed or tired, having little to no interest in sex, low energy and weak muscles or bones - Recommend starting TRT*** - We discussed the most common forms of replacement including intramuscular injection and gels and he desires to start injections*** - Rx testosterone cypionate-200 mg every 2 weeks to start*** - Appointment will be made for injection training*** - Follow-up 5 weeks after starting TRT for testosterone level and symptom check*** - discussed that we follow guidelines for age appropriate testosterone levels to decide on dosing regimens for TRT and this is based on current agreements in the medical community and we will not deviate from this unless there is good scientific data to do so [employer] may not recognize the parameters we use to determine that the patient has low testosterone and therefore, if they want to pursue TRT, it will have be out-of-pocket   - 40-49 ([id number])  (5.3-26.3)    2. ED - continue tadalafil  5 mg daily  3. Chronic testicular pain - He has found no relief with Celebrex , daily Cialis  or pelvic floor stretches -He would like to see if he would be a candidate for spermatic cord denervation as discussed at his previous appointment with Dr. Francisca - I will go ahead and place referral for him to see Dr. Lovie at [facility] urological ***  No follow-ups on file.  These notes generated with voice recognition software. I apologize for typographical errors.  CLOTILDA HELON RIGGERS  [facility] [address]  [address] [place], [place] [address] [phone]

## 2024-04-25 ENCOUNTER — Other Ambulatory Visit
Admission: RE | Admit: 2024-04-25 | Discharge: 2024-04-25 | Disposition: A | Discharge status: Home / Self Care | Attending: Urology | Admitting: Urology

## 2024-04-25 ENCOUNTER — Ambulatory Visit: Admitting: Urology

## 2024-04-25 DIAGNOSIS — N529 Male erectile dysfunction, unspecified: Secondary | ICD-10-CM | POA: Insufficient documentation

## 2024-04-26 LAB — LUTEINIZING HORMONE: LH: 5.5 m[IU]/mL (ref 1.7–8.6)

## 2024-04-26 LAB — PROLACTIN: Prolactin: 12.8 ng/mL (ref 3.9–22.7)

## 2024-04-30 NOTE — Progress Notes (Unsigned)
 05/02/2024 7:49 AM   Carl Mccarty 1981/04/21 [id number]  Referring provider: Eliverto Bette Hover, MD [address] [place],  [place] [address]  Urological history: 1. Pelvic pain - contrast CT (06/2020) no significant urological findings - Lumbar MRI (12/2021) minimal degenerative changes of lumbar spine  2. Undesired fertility - vas ~ ten years ago   3. ED - tadalafil  5 mg daily   Chief Complaint  Patient presents with   Other   HPI: Carl Mccarty is a 43 y.o. man who presents today to discuss testosterone replacement therapy.  Previous records reviewed.  He has been experiencing a decrease in libido, lack of energy, decrease in strength and endurance, decreased enjoyment of life, he is sad and grumpy, his erections are less strong, he has had a deterioration in his ability to play sports and the recent deterioration in his work performance.  He has had 2 morning testosterones below 300, 257 and 291.  His LH was 5.5 and his prolactin was 12.8.  His PSA in November 2024 was 0.70.  He has had 2 paternal uncles with nonfatal prostate cancer.  His mother died of breast cancer.  And he has had two aunts with breast cancer.  He still has not heard from [facility] regarding his referral.  PMH: Past Medical History:  Diagnosis Date   GERD (gastroesophageal reflux disease)     Surgical History: Past Surgical History:  Procedure Laterality Date   FRACTURE SURGERY      Home Medications:  Allergies as of 05/02/2024       Reactions   Oxycodone Rash        Medication List        Accurate as of May 02, 2024 11:59 PM. If you have any questions, ask your nurse or doctor.          buPROPion 300 MG 24 hr tablet Commonly known as: WELLBUTRIN XL Take 1 tablet by mouth daily.   busPIRone 7.5 MG tablet Commonly known as: BUSPAR Take 1 tablet by mouth 2 (two) times daily.   celecoxib  200 MG capsule Commonly known as: CeleBREX  Take 1  capsule (200 mg total) by mouth 2 (two) times daily.   celecoxib  200 MG capsule Commonly known as: CeleBREX  Take 1 capsule (200 mg total) by mouth 2 (two) times daily.   ipratropium 0.06 % nasal spray Commonly known as: ATROVENT Place 2 sprays into both nostrils 3 (three) times daily.   omeprazole 40 MG capsule Commonly known as: PRILOSEC Take 40 mg by mouth daily.   tadalafil  5 MG tablet Commonly known as: CIALIS  Take 1 tablet (5 mg total) by mouth daily as needed for erectile dysfunction (take 45 minutes prior to sexual activity).   triamcinolone cream 0.1 % Commonly known as: KENALOG Apply topically 2 (two) times daily.        Allergies:  Allergies  Allergen Reactions   Oxycodone Rash    Family History: Family History  Problem Relation Age of Onset   Breast cancer Mother    Dementia Father     Social History:  reports that he has never smoked. He has never been exposed to tobacco smoke. He has never used smokeless tobacco. He reports that he does not drink alcohol and does not use drugs.  ROS: Pertinent ROS in HPI  Physical Exam: BP (!) 132/93   Pulse 88   Ht 5' 8 (1.727 m)   Wt 186 lb (84.4 kg)   BMI 28.28 kg/m  Constitutional:  Well nourished. Alert and oriented, No acute distress. HEENT:  AT, moist mucus membranes.  Trachea midline Cardiovascular: No clubbing, cyanosis, or edema. Respiratory: Normal respiratory effort, no increased work of breathing. Neurologic: Grossly intact, no focal deficits, moving all 4 extremities. Psychiatric: Normal mood and affect.   Laboratory Data: See Epic and HPI I have reviewed the labs.   Pertinent Imaging: N/A  Assessment & Plan:    1. Hypogonadism  - He has met the diagnosis of hypogonadism with 2 morning testosterone's below 300 - explained that TRT is not a treatment for ED, he may see some improvement in his erections, but his ED will likely persist even with therapeutic levels of testosterone -  discussed potential side effects of testosterone replacement  including stimulation of erythrocytosis; edema; gynecomastia; worsening sleep apnea; venous thromboembolism; testicular atrophy and infertility.   The theoretical risk of growth stimulation of an undetected prostate cancer was also discussed.  He was informed that current evidence does not provide any definitive answers regarding the risks of testosterone therapy on prostate cancer and cardiovascular disease. The need for periodic monitoring of his testosterone level, PSA, hematocrit and DRE was discussed.  This monitoring will be conducted every three months during the first year of TRT and then every 6 months if blood work remains stable, if there is an abnormality found in follow up blood work, it will result in the monitoring of blood work more frequently  - discussed that we follow guidelines for age appropriate testosterone levels to decide on dosing regimens for TRT and this is based on current agreements in the medical community and we will not deviate from this unless there is good scientific data to do so [facility] [employer] [facility] may not recognize the parameters we use to determine that the patient has low testosterone and therefore, if they want to pursue TRT, it will have be out-of-pocket   - [phone]) (5.3-26.3) -After discussion of the available treatments, he would like to see if his insurance will cover Testopel    2. ED - continue tadalafil  5 mg daily  3. Chronic testicular pain - He has found no relief with Celebrex , daily Cialis  or pelvic floor stretches -He would like to see if he would be a candidate for spermatic cord denervation as discussed at his previous appointment with Dr. Francisca -Will check on his referral to see Dr. Lovie at [facility]   Return for pending Testopel PA .  These notes generated with voice recognition software. I apologize for typographical errors.  CLOTILDA HELON RIGGERS  [facility] [address]  [address] [place], [place] [address] [phone]

## 2024-05-02 ENCOUNTER — Ambulatory Visit: Admitting: Urology

## 2024-05-02 ENCOUNTER — Encounter: Payer: Self-pay | Admitting: Urology

## 2024-05-02 VITALS — BP 132/93 | HR 88 | Ht 68.0 in | Wt 186.0 lb

## 2024-05-02 DIAGNOSIS — N529 Male erectile dysfunction, unspecified: Secondary | ICD-10-CM

## 2024-05-02 DIAGNOSIS — E291 Testicular hypofunction: Secondary | ICD-10-CM

## 2024-05-02 DIAGNOSIS — N50819 Testicular pain, unspecified: Secondary | ICD-10-CM

## 2024-05-03 ENCOUNTER — Telehealth: Payer: Self-pay | Admitting: Urology

## 2024-05-03 NOTE — Telephone Encounter (Signed)
 From the referral team (From my understanding, when it is sent to [facility] (Attn Jori - Per [facility]) - Jori reviews them and then schedules patient)

## 2024-05-03 NOTE — Telephone Encounter (Signed)
 Will you message the referral team and see what the status is on his referral to [facility]?  I sent the referral out on October 24.

## 2024-05-06 NOTE — Telephone Encounter (Signed)
 Called patient to schedule his Testopel appointment and let him know that his insurance was approved for his upcoming visit on 11/18 @4 :00pm. Informed pt the office visit would be about 20 mins. Pt did have a concern for doing the testopel prior to his trip so we had scheduled visit after he comes back. Pt also asked on a update from [place] about his referral to [employer], CMA.

## 2024-05-19 NOTE — Progress Notes (Signed)
He presents today for Testopel insertion.  Patient is placed on the exam table in the *** lateral jackknife position.  Identified upper outer quadrant of hip for insertion; prepped area with *** and injected *** cc's of Lidocaine 1% with Epinephrine to anesthetize superficially and distally along trocar tract.  Made 3 mm incision using 11 blade of scalpel; trocar with sharp ended stylet was inserted into subcutaneous tissue in line with femur. Sharp stylet was withdrawn and *** pellets were placed into trocar well. Testopel pellets advanced into tissue using blunt ended stylet. Trocar removed and incision closed using *** Steri-Strips. Cleansed area to remove *** and covered Steri-Strips with outer Band-Aid.  Careful inspection of insertion is done and patient informed of post procedure instructions.  Advised patient to apply ice to the site for 20-30 minutes every hour if needed.  Avoid hot tubes, swimming or full water immersion of the insertion site for 72 hours.  Bandage may be removed after one week.    Patient is advised to contact the office if experiencing drainage of the insertion site, excessive redness or swelling of the site, chills and/or fevers > 101.5, nausea or vomiting, dizziness or lightheadedness and excessive tenderness.  Avoid strenuous activity and heavy lifting for 72 hours.     He will return in *** month for serum testosterone.

## 2024-05-21 ENCOUNTER — Encounter: Payer: Self-pay | Admitting: Urology

## 2024-05-21 ENCOUNTER — Ambulatory Visit: Admitting: Urology

## 2024-05-21 VITALS — BP 142/96 | HR 80 | Ht 68.0 in | Wt 185.0 lb

## 2024-05-21 DIAGNOSIS — E291 Testicular hypofunction: Secondary | ICD-10-CM | POA: Diagnosis not present

## 2024-05-21 MED ORDER — TESTOSTERONE 75 MG IL PLLT
75.0000 mg | PELLET | Freq: Once | Status: AC
  Administered 2024-05-21: 75 mg

## 2024-05-21 NOTE — Patient Instructions (Signed)
 Check lab levels in about 1 month at our [facility] office.

## 2024-05-24 ENCOUNTER — Telehealth: Payer: Self-pay | Admitting: Urology

## 2024-05-24 NOTE — Telephone Encounter (Signed)
 Will you check to see what the status is on the referral to Dr. Lovie?

## 2024-05-27 NOTE — Telephone Encounter (Signed)
 I don't see where a referral was sent

## 2024-06-17 ENCOUNTER — Other Ambulatory Visit: Admission: RE | Admit: 2024-06-17 | Discharge: 2024-06-17 | Disposition: A | Attending: Urology | Admitting: Urology

## 2024-06-17 DIAGNOSIS — E291 Testicular hypofunction: Secondary | ICD-10-CM | POA: Insufficient documentation

## 2024-06-17 LAB — HEMOGLOBIN AND HEMATOCRIT, BLOOD
HCT: 41.9 % (ref 39.0–52.0)
Hemoglobin: 13.9 g/dL (ref 13.0–17.0)

## 2024-06-18 ENCOUNTER — Ambulatory Visit: Payer: Self-pay | Admitting: Urology

## 2024-06-18 LAB — TESTOSTERONE: Testosterone: 402 ng/dL (ref 264–916)

## 2024-07-11 ENCOUNTER — Other Ambulatory Visit: Payer: Self-pay | Admitting: Urology
